# Patient Record
Sex: Male | Born: 1940 | Race: White | Hispanic: No | Marital: Married | Smoking: Never smoker
Health system: Southern US, Community
[De-identification: ages and names within clinical notes are randomized; demographics above are authoritative.]

## PROBLEM LIST (undated history)

## (undated) DIAGNOSIS — I10 Essential (primary) hypertension: Secondary | ICD-10-CM

## (undated) DIAGNOSIS — K219 Gastro-esophageal reflux disease without esophagitis: Secondary | ICD-10-CM

## (undated) DIAGNOSIS — Z8489 Family history of other specified conditions: Secondary | ICD-10-CM

## (undated) DIAGNOSIS — E119 Type 2 diabetes mellitus without complications: Secondary | ICD-10-CM

## (undated) DIAGNOSIS — M1990 Unspecified osteoarthritis, unspecified site: Secondary | ICD-10-CM

## (undated) DIAGNOSIS — E785 Hyperlipidemia, unspecified: Secondary | ICD-10-CM

## (undated) DIAGNOSIS — N189 Chronic kidney disease, unspecified: Secondary | ICD-10-CM

## (undated) DIAGNOSIS — I2510 Atherosclerotic heart disease of native coronary artery without angina pectoris: Secondary | ICD-10-CM

## (undated) HISTORY — PX: COLONOSCOPY: SHX174

## (undated) HISTORY — PX: CORONARY ANGIOPLASTY: SHX604

## (undated) HISTORY — PX: HERNIA REPAIR: SHX51

## (undated) HISTORY — PX: OTHER SURGICAL HISTORY: SHX169

---

## 2004-12-05 ENCOUNTER — Ambulatory Visit: Payer: Self-pay | Admitting: Family Medicine

## 2004-12-24 ENCOUNTER — Ambulatory Visit: Payer: Self-pay | Admitting: Family Medicine

## 2005-01-24 ENCOUNTER — Ambulatory Visit: Payer: Self-pay | Admitting: Family Medicine

## 2005-02-21 ENCOUNTER — Ambulatory Visit: Payer: Self-pay | Admitting: Family Medicine

## 2005-03-24 ENCOUNTER — Ambulatory Visit: Payer: Self-pay | Admitting: Family Medicine

## 2005-07-11 ENCOUNTER — Ambulatory Visit: Payer: Self-pay | Admitting: General Surgery

## 2005-07-18 ENCOUNTER — Ambulatory Visit: Payer: Self-pay | Admitting: General Surgery

## 2012-04-27 ENCOUNTER — Ambulatory Visit: Payer: Self-pay | Admitting: Medical

## 2012-04-29 LAB — BETA STREP CULTURE(ARMC)

## 2014-11-16 DIAGNOSIS — I10 Essential (primary) hypertension: Secondary | ICD-10-CM | POA: Insufficient documentation

## 2014-11-16 DIAGNOSIS — E7800 Pure hypercholesterolemia, unspecified: Secondary | ICD-10-CM | POA: Insufficient documentation

## 2015-09-17 ENCOUNTER — Emergency Department: Payer: Medicare Other

## 2015-09-17 ENCOUNTER — Emergency Department
Admission: EM | Admit: 2015-09-17 | Discharge: 2015-09-17 | Disposition: A | Discharge status: Home / Self Care | Payer: Medicare Other | Attending: Emergency Medicine | Admitting: Emergency Medicine

## 2015-09-17 ENCOUNTER — Encounter: Payer: Self-pay | Admitting: Emergency Medicine

## 2015-09-17 DIAGNOSIS — S8991XA Unspecified injury of right lower leg, initial encounter: Secondary | ICD-10-CM | POA: Diagnosis present

## 2015-09-17 DIAGNOSIS — S82001A Unspecified fracture of right patella, initial encounter for closed fracture: Secondary | ICD-10-CM

## 2015-09-17 DIAGNOSIS — Y92009 Unspecified place in unspecified non-institutional (private) residence as the place of occurrence of the external cause: Secondary | ICD-10-CM | POA: Insufficient documentation

## 2015-09-17 DIAGNOSIS — S86812A Strain of other muscle(s) and tendon(s) at lower leg level, left leg, initial encounter: Secondary | ICD-10-CM | POA: Insufficient documentation

## 2015-09-17 DIAGNOSIS — Y9389 Activity, other specified: Secondary | ICD-10-CM | POA: Insufficient documentation

## 2015-09-17 DIAGNOSIS — Y998 Other external cause status: Secondary | ICD-10-CM | POA: Diagnosis not present

## 2015-09-17 DIAGNOSIS — E119 Type 2 diabetes mellitus without complications: Secondary | ICD-10-CM | POA: Diagnosis not present

## 2015-09-17 DIAGNOSIS — Z720 Tobacco use: Secondary | ICD-10-CM | POA: Diagnosis not present

## 2015-09-17 DIAGNOSIS — W109XXA Fall (on) (from) unspecified stairs and steps, initial encounter: Secondary | ICD-10-CM | POA: Diagnosis not present

## 2015-09-17 DIAGNOSIS — S86912A Strain of unspecified muscle(s) and tendon(s) at lower leg level, left leg, initial encounter: Secondary | ICD-10-CM

## 2015-09-17 HISTORY — DX: Type 2 diabetes mellitus without complications: E11.9

## 2015-09-17 MED ORDER — TRAMADOL HCL 50 MG PO TABS
100.0000 mg | ORAL_TABLET | Freq: Once | ORAL | Status: AC
  Administered 2015-09-17: 100 mg via ORAL

## 2015-09-17 MED ORDER — TRAMADOL HCL 50 MG PO TABS: 50.0000 mg | ORAL_TABLET | Freq: Two times a day (BID) | ORAL | Status: DC

## 2015-09-17 MED ORDER — BACITRACIN ZINC 500 UNIT/GM EX OINT
TOPICAL_OINTMENT | CUTANEOUS | Status: AC
  Filled 2015-09-17: qty 0.9

## 2015-09-17 MED ORDER — NAPROXEN 500 MG PO TBEC: 500.0000 mg | DELAYED_RELEASE_TABLET | Freq: Two times a day (BID) | ORAL | Status: DC

## 2015-09-17 MED ORDER — TRAMADOL HCL 50 MG PO TABS
ORAL_TABLET | ORAL | Status: AC
  Administered 2015-09-17: 100 mg via ORAL
  Filled 2015-09-17: qty 2

## 2015-09-17 NOTE — ED Notes (Signed)
Pt reports that he was coming down steps and slipped. He states that he heard and felt a "pop" in his right knee and then actually hit the floor with his left knee, which also hurts. Pt doesn't remember if he hit his head or not. NAD noted.

## 2015-09-17 NOTE — ED Provider Notes (Signed)
[facility] Emergency Department Provider Note ____________________________________________  Time seen: 1555  I have reviewed the triage vital signs and the nursing notes.  HISTORY  Chief Complaint  Knee Pain  HPI Clayton Parker is a 74 y.o. male presents to the ED for evaluation of injury sustained following a fall in his home. He describes falling down his steps when he lost his footing on carpet, causing him to twist his right knee, and landed on his left side. He noted an immediate pop to the right knee.He notes pain and tightness to the top of the knee when he attempts to bend the joint, and some discomfort to the lateral aspect of the left knee.  Past Medical History  Diagnosis Date  . Diabetes mellitus without complication    There are no active problems to display for this patient.  Past Surgical History  Procedure Laterality Date  . Cardaic stent      2004  . Hernia repair     Current Outpatient Rx  Name  Route  Sig  Dispense  Refill  . naproxen (EC NAPROSYN) 500 MG EC tablet   Oral   Take 1 tablet (500 mg total) by mouth 2 (two) times daily with a meal.   30 tablet   0   . traMADol (ULTRAM) 50 MG tablet   Oral   Take 1 tablet (50 mg total) by mouth 2 (two) times daily.   10 tablet   0     Allergies Review of patient's allergies indicates no known allergies.  No family history on file.  Social History Social History  Substance Use Topics  . Smoking status: Current Every Day Smoker  . Smokeless tobacco: None  . Alcohol Use: No   Review of Systems  Constitutional: Negative for fever. Eyes: Negative for visual changes. ENT: Negative for sore throat. Cardiovascular: Negative for chest pain. Respiratory: Negative for shortness of breath. Gastrointestinal: Negative for abdominal pain, vomiting and diarrhea. Genitourinary: Negative for dysuria. Musculoskeletal: Negative for back pain. Right > Left knee pain Skin: Negative  for rash. Neurological: Negative for headaches, focal weakness or numbness. ____________________________________________  PHYSICAL EXAM:  VITAL SIGNS: ED Triage Vitals  Enc Vitals Group     BP 09/17/15 1513 117/51 mmHg     Pulse Rate 09/17/15 1513 71     Resp 09/17/15 1513 20     Temp 09/17/15 1513 97.7 F (36.5 C)     Temp Source 09/17/15 1513 Oral     SpO2 09/17/15 1513 98 %     Weight 09/17/15 1513 149 lb (67.586 kg)     Height 09/17/15 1513  (1.651 m)     Head Cir --      Peak Flow --      Pain Score 09/17/15 1514 5     Pain Loc --      Pain Edu? --      Excl. in GC? --    Constitutional: Alert and oriented. Well appearing and in no distress. Eyes: Conjunctivae are normal. PERRL. Normal extraocular movements. ENT   Head: Normocephalic and atraumatic.   Nose: No congestion/rhinorrhea.   Mouth/Throat: Mucous membranes are moist.   Neck: Supple. No thyromegaly. Hematological/Lymphatic/Immunological: No cervical lymphadenopathy. Cardiovascular: Normal rate, regular rhythm.  Respiratory: Normal respiratory effort. No wheezes/rales/rhonchi. Gastrointestinal: Soft and nontender. No distention. Musculoskeletal: Nontender with normal range of motion in all extremities.  Neurologic:  Normal gait without ataxia. Normal speech and language. No gross focal neurologic deficits  are appreciated. Skin:  Skin is warm, dry and intact. No rash noted. Psychiatric: Mood and affect are normal. Patient exhibits appropriate insight and judgment. ____________________________________________   RADIOLOGY  Right Knee IMPRESSION: Acute avulsion fracture from the superior aspect of the patella.  Left Knee IMPRESSION: Degenerative change without acute abnormality.  I, Menshew, Charlesetta Ivory, personally viewed and evaluated these images (plain radiographs) as part of my medical decision making.  ____________________________________________  PROCEDURES  Jones compression  wrap Knee immobilizer ____________________________________________  INITIAL IMPRESSION / ASSESSMENT AND PLAN / ED COURSE  Patient with initial fracture care provided in the ED. He will follow-up with Dr. Rosita Kea for further fracture care of a right patella avulsion fracture. Prescriptions for Naprosyn and Ultram were provided. ----------------------------------------- 4:47 PM on 09/17/2015 ----------------------------------------- Spoke with Dr. Rosita Kea who will see the patient in the office on Wednesday. Possible surgical intervention. Constant knee extension is imperative.   ____________________________________________  FINAL CLINICAL IMPRESSION(S) / ED DIAGNOSES  Final diagnoses:  Patella fracture, right, closed, initial encounter  Knee strain, left, initial encounter       Lissa Hoard, PA-C 09/17/15 1708  Loleta Rose, MD 09/17/15 2358

## 2015-09-17 NOTE — ED Notes (Signed)
[name] PA assisted with demonstrating crutches use and ambulating with crutches and getting into and out of chair. [name] NA applied ace wrap as ordered as well as knee immobilizer.

## 2015-09-17 NOTE — Discharge Instructions (Signed)
Cast or Splint Care Casts and splints support injured limbs and keep bones from moving while they heal.  HOME CARE  Keep the cast or splint uncovered during the drying period.  A plaster cast can take 24 to 48 hours to dry.  A fiberglass cast will dry in less than 1 hour.  Do not rest the cast on anything harder than a pillow for 24 hours.  Do not put weight on your injured limb. Do not put pressure on the cast. Wait for your doctor's approval.  Keep the cast or splint dry.  Cover the cast or splint with a plastic bag during baths or wet weather.  If you have a cast over your chest and belly (trunk), take sponge baths until the cast is taken off.  If your cast gets wet, dry it with a towel or blow dryer. Use the cool setting on the blow dryer.  Keep your cast or splint clean. Wash a dirty cast with a damp cloth.  Do not put any objects under your cast or splint.  Do not scratch the skin under the cast with an object. If itching is a problem, use a blow dryer on a cool setting over the itchy area.  Do not trim or cut your cast.  Do not take out the padding from inside your cast.  Exercise your joints near the cast as told by your doctor.  Raise (elevate) your injured limb on 1 or 2 pillows for the first 1 to 3 days. GET HELP IF:  Your cast or splint cracks.  Your cast or splint is too tight or too loose.  You itch badly under the cast.  Your cast gets wet or has a soft spot.  You have a bad smell coming from the cast.  You get an object stuck under the cast.  Your skin around the cast becomes red or sore.  You have new or more pain after the cast is put on. GET HELP RIGHT AWAY IF:  You have fluid leaking through the cast.  You cannot move your fingers or toes.  Your fingers or toes turn blue or white or are cool, painful, or puffy (swollen).  You have tingling or lose feeling (numbness) around the injured area.  You have bad pain or pressure under the  cast.  You have trouble breathing or have shortness of breath.  You have chest pain. Document Released: 04/11/2011 Document Revised: 08/12/2013 Document Reviewed: 06/18/2013 [facility] Patient Information 2015 [place], [place]. This information is not intended to replace advice given to you by your health care provider. Make sure you discuss any questions you have with your health care provider.  Patellar Fracture, Adult A patellar fracture is a break in your kneecap (patella).  CAUSES   A direct blow to the knee or a fall is usually the cause of a broken patella.  A very hard and strong bending of your knee can cause a patellar fracture. RISK FACTORS Involvement in contact sports, especially sports that involve a lot of jumping. SIGNS AND SYMPTOMS   Tender and swollen knee.  Pain when you move your knee, especially when you try to straighten out your leg.  Difficulty walking or putting weight on your knee.  Misshapen knee (as if a bone is out of place). DIAGNOSIS  Patellar fracture is usually diagnosed with a physical exam and an X-ray exam. TREATMENT  Treatment depends on the type of fracture:  If your patella is still in the right  position after the fracture and you can still straighten your leg out, you can usually be treated with a splint or cast for 4-6 weeks.  If your patella is broken into multiple small pieces but you are able to straighten your leg, you can usually be treated with a splint or cast for 4-6 weeks. Sometimes your patella may need to be removed before the cast is applied.  If you cannot straighten out your leg after a patellar fracture, then surgery is required to hold the bony fragments together until they heal. A cast or splint will be applied for 4-6 weeks. HOME CARE INSTRUCTIONS   Only take over-the-counter or prescription medicines for pain, discomfort, or fever as directed by your health care provider.  Use crutches as directed, and exercise the leg  as directed.  Apply ice to the injured area:  Put ice in a plastic bag.  Place a towel between your skin and the bag.  Leave the ice on for 20 minutes, 2-3 times a day.  Elevate the affected knee above the level of your heart. SEEK MEDICAL CARE IF:  You suspect you have significantly injured your knee.  You hear a pop after a knee injury.  Your knee is misshapen after a knee injury.  You have pain when you move your knee.  You have difficulty walking or putting weight on your knee.  You cannot fully move your knee. SEEK IMMEDIATE MEDICAL CARE IF:  You have redness, swelling, or increasing pain in your knee.  You have a fever. Document Released: 09/08/2003 Document Revised: 09/30/2013 Document Reviewed: 07/22/2013 [facility] Patient Information 2015 [place], [place]. This information is not intended to replace advice given to you by your health care provider. Make sure you discuss any questions you have with your health care provider.   Take the prescription meds as directed. Rest with the leg elevated and splinted at all times. Call Dr. Neomia Glass office on Monday for a Wednesday follow-up appointment.

## 2015-09-17 NOTE — ED Notes (Signed)
Returned bacitracin; removed under wrong patient

## 2015-09-17 NOTE — ED Notes (Signed)
Was going down steps, slipped on last step, felt pop R knee, painful since.

## 2016-05-23 DIAGNOSIS — Z808 Family history of malignant neoplasm of other organs or systems: Secondary | ICD-10-CM | POA: Insufficient documentation

## 2016-10-25 ENCOUNTER — Emergency Department: Payer: Medicare Other

## 2016-10-25 ENCOUNTER — Emergency Department
Admission: EM | Admit: 2016-10-25 | Discharge: 2016-10-25 | Disposition: A | Discharge status: Home / Self Care | Payer: Medicare Other | Attending: Emergency Medicine | Admitting: Emergency Medicine

## 2016-10-25 DIAGNOSIS — W1809XA Striking against other object with subsequent fall, initial encounter: Secondary | ICD-10-CM | POA: Insufficient documentation

## 2016-10-25 DIAGNOSIS — F17200 Nicotine dependence, unspecified, uncomplicated: Secondary | ICD-10-CM | POA: Insufficient documentation

## 2016-10-25 DIAGNOSIS — Z79899 Other long term (current) drug therapy: Secondary | ICD-10-CM | POA: Insufficient documentation

## 2016-10-25 DIAGNOSIS — Z23 Encounter for immunization: Secondary | ICD-10-CM | POA: Insufficient documentation

## 2016-10-25 DIAGNOSIS — Y939 Activity, unspecified: Secondary | ICD-10-CM | POA: Insufficient documentation

## 2016-10-25 DIAGNOSIS — S0181XA Laceration without foreign body of other part of head, initial encounter: Secondary | ICD-10-CM | POA: Diagnosis not present

## 2016-10-25 DIAGNOSIS — E119 Type 2 diabetes mellitus without complications: Secondary | ICD-10-CM | POA: Diagnosis not present

## 2016-10-25 DIAGNOSIS — Y999 Unspecified external cause status: Secondary | ICD-10-CM | POA: Insufficient documentation

## 2016-10-25 DIAGNOSIS — S0990XA Unspecified injury of head, initial encounter: Secondary | ICD-10-CM | POA: Diagnosis present

## 2016-10-25 DIAGNOSIS — W19XXXA Unspecified fall, initial encounter: Secondary | ICD-10-CM

## 2016-10-25 DIAGNOSIS — Z791 Long term (current) use of non-steroidal anti-inflammatories (NSAID): Secondary | ICD-10-CM | POA: Insufficient documentation

## 2016-10-25 DIAGNOSIS — Y929 Unspecified place or not applicable: Secondary | ICD-10-CM | POA: Diagnosis not present

## 2016-10-25 MED ORDER — TETANUS-DIPHTH-ACELL PERTUSSIS 5-2.5-18.5 LF-MCG/0.5 IM SUSP
0.5000 mL | Freq: Once | INTRAMUSCULAR | Status: AC
  Administered 2016-10-25: 0.5 mL via INTRAMUSCULAR
  Filled 2016-10-25: qty 0.5

## 2016-10-25 NOTE — ED Provider Notes (Signed)
[facility] Emergency Department Provider Note   ____________________________________________   First MD Initiated Contact with Patient 10/25/16 1935     (approximate)  I have reviewed the triage vital signs and the nursing notes.   HISTORY  Chief Complaint Fall   HPI Clayton Parker is a 75 y.o. male with a history of diabetes who had a mechanical fall this evening and fell headfirst into a wall. He denies any loss of consciousness. Says that he has a frontal headache over the site of the injury. Says that he is also experiencing some "stiffness" across the back of his neck but does not have any pain and is able to range his neck freely.Denies being on any blood thinning medications.   Past Medical History:  Diagnosis Date  . Diabetes mellitus without complication (HCC)     There are no active problems to display for this patient.   Past Surgical History:  Procedure Laterality Date  . cardaic stent     2004  . HERNIA REPAIR      Prior to Admission medications   Medication Sig Start Date End Date Taking? Authorizing Provider  naproxen (EC NAPROSYN) 500 MG EC tablet Take 1 tablet (500 mg total) by mouth 2 (two) times daily with a meal. 09/17/15   [name] V Bacon Menshew, PA-C  traMADol (ULTRAM) 50 MG tablet Take 1 tablet (50 mg total) by mouth 2 (two) times daily. 09/17/15   [name] V Bacon Menshew, PA-C    Allergies Review of patient's allergies indicates no known allergies.  No family history on file.  Social History Social History  Substance Use Topics  . Smoking status: Current Every Day Smoker  . Smokeless tobacco: Never Used  . Alcohol use No    Review of Systems Constitutional: No fever/chills Eyes: No visual changes. ENT: No sore throat. Cardiovascular: Denies chest pain. Respiratory: Denies shortness of breath. Gastrointestinal: No abdominal pain.  No nausea, no vomiting.  No diarrhea.  No constipation. Genitourinary:  Negative for dysuria. Musculoskeletal: Negative for back pain. Skin: Negative for rash. Neurological: Negative for  focal weakness or numbness.  10-point ROS otherwise negative.  ____________________________________________   PHYSICAL EXAM:  VITAL SIGNS: ED Triage Vitals  Enc Vitals Group     BP 10/25/16 1930 (!) 157/68     Pulse Rate 10/25/16 1930 90     Resp 10/25/16 1931 19     Temp 10/25/16 1931 97.9 F (36.6 C)     Temp Source 10/25/16 1931 Oral     SpO2 10/25/16 1930 97 %     Weight 10/25/16 1932 150 lb (68 kg)     Height 10/25/16 1932 5\' 5"  (1.651 m)     Head Circumference --      Peak Flow --      Pain Score 10/25/16 1932 4     Pain Loc --      Pain Edu? --      Excl. in GC? --     Constitutional: Alert and oriented. Well appearing and in no acute distress. Eyes: Conjunctivae are normal. PERRL. EOMI. Head:  Frontal hematoma 3 x 4 cm without any bogginess or depression. 3 cm laceration which appears superficial and well approximated. Small oozing of blood that only uses when the bandage is removed. Nose: No congestion/rhinnorhea. Mouth/Throat: Mucous membranes are moist.   Neck: No stridor.  No tenderness to midline cervical spine. No deformity or step-off. The patient is able to range his head and  neck freely without any obvious extraction or severe pain.  Cardiovascular: Normal rate, regular rhythm. Grossly normal heart sounds.  Respiratory: Normal respiratory effort.  No retractions. Lungs CTAB. Gastrointestinal: Soft and nontender. No distention. Musculoskeletal: Abrasion to the anterior left knee without any joint effusion. Patient with full active range of motion of the left knee without any pain. Neurologic:  Normal speech and language. No gross focal neurologic deficits are appreciated. Skin:  Skin is warm, dry and intact. No rash noted. Psychiatric: Mood and affect are normal. Speech and behavior are normal.  ____________________________________________    LABS (all labs ordered are listed, but only abnormal results are displayed)  Labs Reviewed - No data to display ____________________________________________  EKG   ____________________________________________  RADIOLOGY  CT Head Wo Contrast (Final result)  Result time 10/25/16 20:09:21  Final result by Princella PellegriniHarold Hall, MD (10/25/16 [id number]:09:21)           Narrative:   CLINICAL DATA: 75 year old male status post fall at home with frontal hematoma. Initial encounter.  EXAM: CT HEAD WITHOUT CONTRAST  TECHNIQUE: Contiguous axial images were obtained from the base of the skull through the vertex without intravenous contrast.  COMPARISON: None.  FINDINGS: Brain: No midline shift, ventriculomegaly, mass effect, evidence of mass lesion, intracranial hemorrhage or evidence of cortically based acute infarction. Mild for age scattered white matter hypodensity. No cortical encephalomalacia identified. Cerebral volume is within normal limits for age.  Vascular: Calcified atherosclerosis at the skull base.  Skull: No acute osseous abnormality identified.  Sinuses/Orbits: Partially visible right maxillary sinus mucosal thickening. Other Visualized paranasal sinuses and mastoids are well pneumatized.  Other: Right anterior superior forehead scalp hematoma and laceration with subcutaneous gas and hematoma measuring up to 9 mm in thickness. Underlying right frontal bone intact.  Visualized orbit soft tissues are within normal limits. Other scalp soft tissues appear normal.  IMPRESSION: 1. Scalp soft tissue injury without underlying fracture. 2. No acute intracranial abnormality and largely unremarkable for age noncontrast CT appearance of the brain.   Electronically Signed By: Odessa FlemingH Hall M.D. On: 10/25/2016 20:09            ____________________________________________   PROCEDURES  Procedure(s) performed:   Procedures  Critical Care performed:    ____________________________________________   INITIAL IMPRESSION / ASSESSMENT AND PLAN / ED COURSE  Pertinent labs & imaging results that were available during my care of the patient were reviewed by me and considered in my medical decision making (see chart for details).  ----------------------------------------- 8:42 PM on 10/25/2016 -----------------------------------------  Well approximated laceration to the forehead without any bleeding. The bandages when there is gauze overlying. I feel that suturing her skin glue would be high risk of infection and scarring than leaving the laceration open. Explained this to the patient as well as keeping it dry for 24 hours. We also discussed return precautions including any worsening or concerning symptoms. The patient's wife is at the bedside and they are both understanding of this plan went to comply. Will be discharged home.  Clinical Course     ____________________________________________   FINAL CLINICAL IMPRESSION(S) / ED DIAGNOSES  Mechanical fall. Head laceration.    NEW MEDICATIONS STARTED DURING THIS VISIT:  New Prescriptions   No medications on file     Note:  This document was prepared using Dragon voice recognition software and may include unintentional dictation errors.    Myrna Blazeravid Matthew Katharina Jehle, MD 10/25/16 2043

## 2016-10-25 NOTE — ED Triage Notes (Signed)
Pt bib EMS from home w/ c/o mechanical fall.  Pt has abrasion to forehead.  Denies LOC or blood thinners.  Pt A/O X4.  Able to ambulate to EMS truck.  NAD.

## 2016-10-25 NOTE — ED Notes (Signed)
Laceration to forehead wrapped with curlex at this time. Wound clean and dry at this time

## 2017-11-27 ENCOUNTER — Other Ambulatory Visit: Payer: Self-pay

## 2017-11-27 ENCOUNTER — Encounter: Payer: Self-pay | Admitting: *Deleted

## 2017-11-29 NOTE — Discharge Instructions (Signed)

## 2017-12-03 ENCOUNTER — Encounter
Admission: RE | Disposition: A | Discharge status: Home / Self Care | Payer: Self-pay | Source: Ambulatory Visit | Attending: Ophthalmology

## 2017-12-03 ENCOUNTER — Ambulatory Visit: Payer: Medicare Other | Admitting: Anesthesiology

## 2017-12-03 ENCOUNTER — Ambulatory Visit
Admission: RE | Admit: 2017-12-03 | Discharge: 2017-12-03 | Disposition: A | Discharge status: Home / Self Care | Payer: Medicare Other | Source: Ambulatory Visit | Attending: Ophthalmology | Admitting: Ophthalmology

## 2017-12-03 DIAGNOSIS — K219 Gastro-esophageal reflux disease without esophagitis: Secondary | ICD-10-CM | POA: Insufficient documentation

## 2017-12-03 DIAGNOSIS — I2510 Atherosclerotic heart disease of native coronary artery without angina pectoris: Secondary | ICD-10-CM | POA: Diagnosis not present

## 2017-12-03 DIAGNOSIS — E119 Type 2 diabetes mellitus without complications: Secondary | ICD-10-CM | POA: Insufficient documentation

## 2017-12-03 DIAGNOSIS — G4730 Sleep apnea, unspecified: Secondary | ICD-10-CM | POA: Insufficient documentation

## 2017-12-03 DIAGNOSIS — Z7984 Long term (current) use of oral hypoglycemic drugs: Secondary | ICD-10-CM | POA: Insufficient documentation

## 2017-12-03 DIAGNOSIS — H2512 Age-related nuclear cataract, left eye: Secondary | ICD-10-CM | POA: Insufficient documentation

## 2017-12-03 DIAGNOSIS — Z955 Presence of coronary angioplasty implant and graft: Secondary | ICD-10-CM | POA: Diagnosis not present

## 2017-12-03 DIAGNOSIS — Z79899 Other long term (current) drug therapy: Secondary | ICD-10-CM | POA: Diagnosis not present

## 2017-12-03 DIAGNOSIS — Z85828 Personal history of other malignant neoplasm of skin: Secondary | ICD-10-CM | POA: Insufficient documentation

## 2017-12-03 DIAGNOSIS — Z7982 Long term (current) use of aspirin: Secondary | ICD-10-CM | POA: Insufficient documentation

## 2017-12-03 DIAGNOSIS — I10 Essential (primary) hypertension: Secondary | ICD-10-CM | POA: Insufficient documentation

## 2017-12-03 HISTORY — DX: Essential (primary) hypertension: I10

## 2017-12-03 HISTORY — DX: Family history of other specified conditions: Z84.89

## 2017-12-03 HISTORY — PX: CATARACT EXTRACTION W/PHACO: SHX586

## 2017-12-03 HISTORY — DX: Hyperlipidemia, unspecified: E78.5

## 2017-12-03 LAB — GLUCOSE, CAPILLARY
GLUCOSE-CAPILLARY: 150 mg/dL — AB (ref 65–99)
Glucose-Capillary: 121 mg/dL — ABNORMAL HIGH (ref 65–99)

## 2017-12-03 SURGERY — PHACOEMULSIFICATION, CATARACT, WITH IOL INSERTION
Anesthesia: General | Laterality: Left | Wound class: Clean

## 2017-12-03 MED ORDER — MIDAZOLAM HCL 2 MG/2ML IJ SOLN
INTRAMUSCULAR | Status: DC | PRN
  Administered 2017-12-03: 1.5 mg via INTRAVENOUS

## 2017-12-03 MED ORDER — MOXIFLOXACIN HCL 0.5 % OP SOLN
OPHTHALMIC | Status: DC | PRN
  Administered 2017-12-03: 0.2 mL via OPHTHALMIC

## 2017-12-03 MED ORDER — ACETAMINOPHEN 325 MG PO TABS: 650.0000 mg | ORAL_TABLET | Freq: Once | ORAL | Status: DC | PRN

## 2017-12-03 MED ORDER — LACTATED RINGERS IV SOLN: INTRAVENOUS | Status: DC

## 2017-12-03 MED ORDER — SODIUM HYALURONATE 23 MG/ML IO SOLN
INTRAOCULAR | Status: DC | PRN
  Administered 2017-12-03: 0.6 mL via INTRAOCULAR

## 2017-12-03 MED ORDER — FENTANYL CITRATE (PF) 100 MCG/2ML IJ SOLN
INTRAMUSCULAR | Status: DC | PRN
  Administered 2017-12-03: 50 ug via INTRAVENOUS

## 2017-12-03 MED ORDER — ARMC OPHTHALMIC DILATING DROPS
1.0000 | OPHTHALMIC | Status: DC | PRN
Start: 2017-12-03 — End: 2017-12-03
  Administered 2017-12-03 (×3): 1 via OPHTHALMIC

## 2017-12-03 MED ORDER — LIDOCAINE HCL (PF) 2 % IJ SOLN
INTRAOCULAR | Status: DC | PRN
  Administered 2017-12-03: 1 mL via INTRAOCULAR

## 2017-12-03 MED ORDER — ONDANSETRON HCL 4 MG/2ML IJ SOLN: 4.0000 mg | Freq: Once | INTRAMUSCULAR | Status: DC | PRN

## 2017-12-03 MED ORDER — ACETAMINOPHEN 160 MG/5ML PO SOLN
325.0000 mg | ORAL | Status: DC | PRN
Start: 2017-12-03 — End: 2017-12-03

## 2017-12-03 MED ORDER — BSS IO SOLN
INTRAOCULAR | Status: DC | PRN
  Administered 2017-12-03: 104 mL via OPHTHALMIC

## 2017-12-03 MED ORDER — SODIUM HYALURONATE 10 MG/ML IO SOLN
INTRAOCULAR | Status: DC | PRN
  Administered 2017-12-03: 0.55 mL via INTRAOCULAR

## 2017-12-03 SURGICAL SUPPLY — 16 items

## 2017-12-03 NOTE — Anesthesia Preprocedure Evaluation (Signed)
Anesthesia Evaluation  Patient identified by MRN, date of birth, ID band Patient awake    History of Anesthesia Complications Negative for: history of anesthetic complications  Airway Mallampati: IV  TM Distance: >3 FB Neck ROM: Full    Dental  (+)    Pulmonary  Hx sleep apnea, resolved s/p weight loss   Pulmonary exam normal breath sounds clear to auscultation       Cardiovascular Exercise Tolerance: Good hypertension, + CAD and + Cardiac Stents  Normal cardiovascular exam Rhythm:Regular Rate:Normal  ECG 05/16/17: normal   Neuro/Psych negative neurological ROS     GI/Hepatic GERD  ,  Endo/Other  diabetes, Type 2  Renal/GU negative Renal ROS     Musculoskeletal   Abdominal   Peds  Hematology negative hematology ROS (+)   Anesthesia Other Findings   Reproductive/Obstetrics                             Anesthesia Physical Anesthesia Plan  ASA: III  Anesthesia Plan: General   Post-op Pain Management:    Induction: Intravenous  PONV Risk Score and Plan: 1  Airway Management Planned: Natural Airway  Additional Equipment:   Intra-op Plan:   Post-operative Plan:   Informed Consent: I have reviewed the patients History and Physical, chart, labs and discussed the procedure including the risks, benefits and alternatives for the proposed anesthesia with the patient or authorized representative who has indicated his/her understanding and acceptance.     Plan Discussed with: CRNA  Anesthesia Plan Comments:         Anesthesia Quick Evaluation

## 2017-12-03 NOTE — H&P (Signed)
The History and Physical notes are on paper, have been signed, and are to be scanned.   I have examined the patient and there are no changes to the H&P.   Willey BladeBradley Jamille Yoshino 12/03/2017 8:52 AM

## 2017-12-03 NOTE — Transfer of Care (Signed)
Immediate Anesthesia Transfer of Care Note  Patient: Clayton Parker  Procedure(s) Performed: CATARACT EXTRACTION PHACO AND INTRAOCULAR LENS PLACEMENT (IOC) LEFT DIABETIC (Left )  Patient Location: PACU  Anesthesia Type: General  Level of Consciousness: awake, alert  and patient cooperative  Airway and Oxygen Therapy: Patient Spontanous Breathing and Patient connected to supplemental oxygen  Post-op Assessment: Post-op Vital signs reviewed, Patient's Cardiovascular Status Stable, Respiratory Function Stable, Patent Airway and No signs of Nausea or vomiting  Post-op Vital Signs: Reviewed and stable  Complications: No apparent anesthesia complications

## 2017-12-03 NOTE — Op Note (Signed)
OPERATIVE NOTE  Clayton SheererFrederick J Nabi [id number] 12/03/2017   PREOPERATIVE DIAGNOSIS:  Nuclear sclerotic cataract left eye.  H25.12   POSTOPERATIVE DIAGNOSIS:     1.  Nuclear sclerotic cataract left eye.   2.  Intraoperative floppy iris syndrome.   PROCEDURE:  Phacoemusification with posterior chamber intraocular lens placement of the left eye   LENS:  * No implants in log *   ZCB00 +20.0  ZCB00 +20.0 SN [id number] Exp 09/2020   ULTRASOUND TIME:  0 minutes 52.9 seconds.  CDE 12.04   SURGEON:  Willey BladeBradley Montrae Braithwaite, MD, MPH   ANESTHESIA:  Topical with tetracaine drops augmented with 1% preservative-free intracameral lidocaine.  ESTIMATED BLOOD LOSS: <1 mL   COMPLICATIONS:  None.   DESCRIPTION OF PROCEDURE:  The patient was identified in the holding room and transported to the operating room and placed in the supine position under the operating microscope.  The left eye was identified as the operative eye and it was prepped and draped in the usual sterile ophthalmic fashion.   A 1.0 millimeter clear-corneal paracentesis was made at the 5:00 position. 0.5 ml of preservative-free 1% lidocaine with epinephrine was injected into the anterior chamber.  The anterior chamber was filled with Healon 5 viscoelastic.  A 2.4 millimeter keratome was used to make a near-clear corneal incision at the 2:00 position.  A curvilinear capsulorrhexis was made with a cystotome and capsulorrhexis forceps.  Balanced salt solution was used to hydrodissect and hydrodelineate the nucleus.  The pupil was relatively small and floppy but did not require a pupil expansion device.    Phacoemulsification was then used in stop and chop fashion to remove the lens nucleus and epinucleus.  The remaining cortex was then removed using the irrigation and aspiration handpiece.   The periphery was carefully inspected and there were no remaining lens fragments.  Healon was then placed into the capsular bag to distend it for lens  placement.  On the first attempt to inject the lens it was stuck in the wound and was removed and reloaded into a cartridge.  The lens was then injected into the capsular bag.  The remaining viscoelastic was aspirated.   Wounds were hydrated with balanced salt solution.  The anterior chamber was inflated to a physiologic pressure with balanced salt solution.   Intracameral vigamox 0.1 mL undiltued was injected into the eye and a drop placed onto the ocular surface.  No wound leaks were noted.  The patient was taken to the recovery room in stable condition without complications of anesthesia or surgery  Willey BladeBradley Hayze Gazda 12/03/2017, 9:58 AM

## 2017-12-03 NOTE — Anesthesia Procedure Notes (Signed)
Procedure Name: MAC Performed by: Taj Nevins, CRNA Pre-anesthesia Checklist: Patient identified, Emergency Drugs available, Suction available, Timeout performed and Patient being monitored Patient Re-evaluated:Patient Re-evaluated prior to induction Oxygen Delivery Method: Nasal cannula Placement Confirmation: positive ETCO2       

## 2017-12-03 NOTE — Anesthesia Postprocedure Evaluation (Signed)
Anesthesia Post Note  Patient: Clayton Parker  Procedure(s) Performed: CATARACT EXTRACTION PHACO AND INTRAOCULAR LENS PLACEMENT (IOC) LEFT DIABETIC (Left )  Patient location during evaluation: PACU Anesthesia Type: General Level of consciousness: awake and alert, oriented and patient cooperative Pain management: pain level controlled Vital Signs Assessment: post-procedure vital signs reviewed and stable Respiratory status: spontaneous breathing, nonlabored ventilation and respiratory function stable Cardiovascular status: blood pressure returned to baseline and stable Postop Assessment: adequate PO intake Anesthetic complications: no    Reed BreechAndrea Daryn Pisani

## 2017-12-04 ENCOUNTER — Encounter: Payer: Self-pay | Admitting: Ophthalmology

## 2018-02-12 ENCOUNTER — Other Ambulatory Visit: Payer: Self-pay

## 2018-02-12 ENCOUNTER — Encounter: Payer: Self-pay | Admitting: *Deleted

## 2018-02-17 NOTE — Discharge Instructions (Signed)

## 2018-02-18 ENCOUNTER — Ambulatory Visit
Admission: RE | Admit: 2018-02-18 | Discharge: 2018-02-18 | Disposition: A | Discharge status: Home / Self Care | Payer: Medicare Other | Source: Ambulatory Visit | Attending: Ophthalmology | Admitting: Ophthalmology

## 2018-02-18 ENCOUNTER — Ambulatory Visit: Payer: Medicare Other | Admitting: Anesthesiology

## 2018-02-18 ENCOUNTER — Encounter
Admission: RE | Disposition: A | Discharge status: Home / Self Care | Payer: Self-pay | Source: Ambulatory Visit | Attending: Ophthalmology

## 2018-02-18 DIAGNOSIS — H2511 Age-related nuclear cataract, right eye: Secondary | ICD-10-CM | POA: Diagnosis present

## 2018-02-18 DIAGNOSIS — I10 Essential (primary) hypertension: Secondary | ICD-10-CM | POA: Diagnosis not present

## 2018-02-18 DIAGNOSIS — E119 Type 2 diabetes mellitus without complications: Secondary | ICD-10-CM | POA: Diagnosis not present

## 2018-02-18 DIAGNOSIS — Z85828 Personal history of other malignant neoplasm of skin: Secondary | ICD-10-CM | POA: Diagnosis not present

## 2018-02-18 DIAGNOSIS — Z955 Presence of coronary angioplasty implant and graft: Secondary | ICD-10-CM | POA: Insufficient documentation

## 2018-02-18 DIAGNOSIS — K219 Gastro-esophageal reflux disease without esophagitis: Secondary | ICD-10-CM | POA: Diagnosis not present

## 2018-02-18 DIAGNOSIS — I2510 Atherosclerotic heart disease of native coronary artery without angina pectoris: Secondary | ICD-10-CM | POA: Diagnosis not present

## 2018-02-18 HISTORY — PX: CATARACT EXTRACTION W/PHACO: SHX586

## 2018-02-18 LAB — GLUCOSE, CAPILLARY
GLUCOSE-CAPILLARY: 106 mg/dL — AB (ref 65–99)
Glucose-Capillary: 122 mg/dL — ABNORMAL HIGH (ref 65–99)

## 2018-02-18 SURGERY — PHACOEMULSIFICATION, CATARACT, WITH IOL INSERTION
Anesthesia: Monitor Anesthesia Care | Laterality: Right | Wound class: Clean

## 2018-02-18 MED ORDER — LACTATED RINGERS IV SOLN: INTRAVENOUS | Status: DC

## 2018-02-18 MED ORDER — EPINEPHRINE PF 1 MG/ML IJ SOLN
INTRAOCULAR | Status: DC | PRN
  Administered 2018-02-18: 101 mL via OPHTHALMIC

## 2018-02-18 MED ORDER — ACETAMINOPHEN 160 MG/5ML PO SOLN: 325.0000 mg | Freq: Once | ORAL | Status: DC

## 2018-02-18 MED ORDER — ACETAMINOPHEN 325 MG PO TABS: 325.0000 mg | ORAL_TABLET | Freq: Once | ORAL | Status: DC

## 2018-02-18 MED ORDER — FENTANYL CITRATE (PF) 100 MCG/2ML IJ SOLN
INTRAMUSCULAR | Status: DC | PRN
  Administered 2018-02-18: 50 ug via INTRAVENOUS

## 2018-02-18 MED ORDER — MIDAZOLAM HCL 2 MG/2ML IJ SOLN
INTRAMUSCULAR | Status: DC | PRN
  Administered 2018-02-18: 1 mg via INTRAVENOUS

## 2018-02-18 MED ORDER — LIDOCAINE HCL (PF) 2 % IJ SOLN
INTRAOCULAR | Status: DC | PRN
  Administered 2018-02-18: 1 mL via INTRAOCULAR

## 2018-02-18 MED ORDER — MOXIFLOXACIN HCL 0.5 % OP SOLN
OPHTHALMIC | Status: DC | PRN
  Administered 2018-02-18: 0.2 mL via OPHTHALMIC

## 2018-02-18 MED ORDER — SODIUM HYALURONATE 23 MG/ML IO SOLN
INTRAOCULAR | Status: DC | PRN
  Administered 2018-02-18: 0.6 mL via INTRAOCULAR

## 2018-02-18 MED ORDER — ARMC OPHTHALMIC DILATING DROPS
1.0000 "application " | OPHTHALMIC | Status: DC | PRN
  Administered 2018-02-18 (×3): 1 via OPHTHALMIC

## 2018-02-18 MED ORDER — SODIUM HYALURONATE 10 MG/ML IO SOLN
INTRAOCULAR | Status: DC | PRN
  Administered 2018-02-18: 0.55 mL via INTRAOCULAR

## 2018-02-18 SURGICAL SUPPLY — 16 items

## 2018-02-18 NOTE — Op Note (Signed)
OPERATIVE NOTE  Clayton SheererFrederick J Haub [id number] 02/18/2018   PREOPERATIVE DIAGNOSIS:  Nuclear sclerotic cataract right eye.  H25.11   POSTOPERATIVE DIAGNOSIS:    Nuclear sclerotic cataract right eye.     PROCEDURE:  Phacoemusification with posterior chamber intraocular lens placement of the right eye   LENS:   Implant Name Type Inv. Item Serial No. Manufacturer Lot No. LRB No. Used  LENS IOL DIOP 22.0 - [id number] Intraocular Lens LENS IOL DIOP 22.0 [id number]  Right 1       PCB00 +22.0   ULTRASOUND TIME: 1 minutes 02 seconds.  CDE 12.33   SURGEON:  Willey BladeBradley King, MD, MPH  ANESTHESIOLOGIST: Anesthesiologist: Ranee GosselinStella, Michael, MD CRNA: Maryan RuedWilson, Jennifer M, CRNA   ANESTHESIA:  Topical with tetracaine drops augmented with 1% preservative-free intracameral lidocaine.  ESTIMATED BLOOD LOSS: less than 1 mL.   COMPLICATIONS:  None.   DESCRIPTION OF PROCEDURE:  The patient was identified in the holding room and transported to the operating room and placed in the supine position under the operating microscope.  The right eye was identified as the operative eye and it was prepped and draped in the usual sterile ophthalmic fashion.   A 1.0 millimeter clear-corneal paracentesis was made at the 10:30 position. 0.5 ml of preservative-free 1% lidocaine with epinephrine was injected into the anterior chamber.  The anterior chamber was filled with Healon 5 viscoelastic.  A 2.4 millimeter keratome was used to make a near-clear corneal incision at the 8:00 position.  A curvilinear capsulorrhexis was made with a cystotome and capsulorrhexis forceps.  Balanced salt solution was used to hydrodissect and hydrodelineate the nucleus.   Phacoemulsification was then used in stop and chop fashion to remove the lens nucleus and epinucleus.  The remaining cortex was then removed using the irrigation and aspiration handpiece. Healon was then placed into the capsular bag to distend it for lens placement.  A lens  was then injected into the capsular bag.  The remaining viscoelastic was aspirated.   Wounds were hydrated with balanced salt solution.  The anterior chamber was inflated to a physiologic pressure with balanced salt solution.   Intracameral vigamox 0.1 mL undiluted was injected into the eye and a drop placed onto the ocular surface.  No wound leaks were noted.  The patient was taken to the recovery room in stable condition without complications of anesthesia or surgery  Willey BladeBradley King 02/18/2018, 9:58 AM

## 2018-02-18 NOTE — Anesthesia Postprocedure Evaluation (Signed)
Anesthesia Post Note  Patient: Clayton Parker  Procedure(s) Performed: CATARACT EXTRACTION PHACO AND INTRAOCULAR LENS PLACEMENT (IOC) RIGHT DIABETIC (Right )  Patient location during evaluation: PACU Anesthesia Type: MAC Level of consciousness: awake and alert and oriented Pain management: satisfactory to patient Vital Signs Assessment: post-procedure vital signs reviewed and stable Respiratory status: spontaneous breathing, nonlabored ventilation and respiratory function stable Cardiovascular status: blood pressure returned to baseline and stable Postop Assessment: Adequate PO intake and No signs of nausea or vomiting Anesthetic complications: no    Raliegh Ip

## 2018-02-18 NOTE — Anesthesia Preprocedure Evaluation (Signed)
Anesthesia Evaluation  Patient identified by MRN, date of birth, ID band Patient awake    Reviewed: Allergy & Precautions, H&P , NPO status , Patient's Chart, lab work & pertinent test results  History of Anesthesia Complications Negative for: history of anesthetic complications  Airway Mallampati: IV  TM Distance: >3 FB Neck ROM: Full    Dental no notable dental hx.    Pulmonary  Hx sleep apnea, resolved s/p weight loss   Pulmonary exam normal breath sounds clear to auscultation       Cardiovascular Exercise Tolerance: Good hypertension, + CAD and + Cardiac Stents  Normal cardiovascular exam Rhythm:Regular Rate:Normal  ECG 05/16/17: normal   Neuro/Psych negative neurological ROS     GI/Hepatic GERD  ,  Endo/Other  diabetes, Type 2  Renal/GU      Musculoskeletal   Abdominal   Peds  Hematology negative hematology ROS (+)   Anesthesia Other Findings   Reproductive/Obstetrics                             Anesthesia Physical Anesthesia Plan  ASA: II  Anesthesia Plan: MAC   Post-op Pain Management:    Induction:   PONV Risk Score and Plan: 1 and Midazolam and Treatment may vary due to age or medical condition  Airway Management Planned: Natural Airway  Additional Equipment:   Intra-op Plan:   Post-operative Plan:   Informed Consent: I have reviewed the patients History and Physical, chart, labs and discussed the procedure including the risks, benefits and alternatives for the proposed anesthesia with the patient or authorized representative who has indicated his/her understanding and acceptance.     Plan Discussed with: CRNA  Anesthesia Plan Comments:         Anesthesia Quick Evaluation

## 2018-02-18 NOTE — H&P (Signed)
The History and Physical notes are on paper, have been signed, and are to be scanned.   I have examined the patient and there are no changes to the H&P.   Willey BladeBradley Ethin Drummond 02/18/2018 9:21 AM

## 2018-02-18 NOTE — Anesthesia Procedure Notes (Signed)
Procedure Name: MAC Date/Time: 02/18/2018 9:34 AM Performed by: Janna Arch, CRNA Pre-anesthesia Checklist: Patient identified, Emergency Drugs available, Suction available and Patient being monitored Patient Re-evaluated:Patient Re-evaluated prior to induction Oxygen Delivery Method: Nasal cannula

## 2018-02-18 NOTE — Transfer of Care (Signed)
Immediate Anesthesia Transfer of Care Note  Patient: Clayton Parker  Procedure(s) Performed: CATARACT EXTRACTION PHACO AND INTRAOCULAR LENS PLACEMENT (IOC) RIGHT DIABETIC (Right )  Patient Location: PACU  Anesthesia Type: MAC  Level of Consciousness: awake, alert  and patient cooperative  Airway and Oxygen Therapy: Patient Spontanous Breathing and Patient connected to supplemental oxygen  Post-op Assessment: Post-op Vital signs reviewed, Patient's Cardiovascular Status Stable, Respiratory Function Stable, Patent Airway and No signs of Nausea or vomiting  Post-op Vital Signs: Reviewed and stable  Complications: No apparent anesthesia complications

## 2018-03-21 ENCOUNTER — Other Ambulatory Visit: Payer: Self-pay

## 2018-03-21 ENCOUNTER — Emergency Department
Admission: EM | Admit: 2018-03-21 | Discharge: 2018-03-22 | Disposition: A | Discharge status: Home / Self Care | Payer: Medicare Other | Attending: Emergency Medicine | Admitting: Emergency Medicine

## 2018-03-21 ENCOUNTER — Emergency Department: Payer: Medicare Other

## 2018-03-21 DIAGNOSIS — Z7984 Long term (current) use of oral hypoglycemic drugs: Secondary | ICD-10-CM | POA: Diagnosis not present

## 2018-03-21 DIAGNOSIS — Z7982 Long term (current) use of aspirin: Secondary | ICD-10-CM | POA: Insufficient documentation

## 2018-03-21 DIAGNOSIS — Z79899 Other long term (current) drug therapy: Secondary | ICD-10-CM | POA: Diagnosis not present

## 2018-03-21 DIAGNOSIS — R509 Fever, unspecified: Secondary | ICD-10-CM | POA: Diagnosis not present

## 2018-03-21 DIAGNOSIS — E119 Type 2 diabetes mellitus without complications: Secondary | ICD-10-CM | POA: Diagnosis not present

## 2018-03-21 DIAGNOSIS — R0602 Shortness of breath: Secondary | ICD-10-CM | POA: Insufficient documentation

## 2018-03-21 DIAGNOSIS — J069 Acute upper respiratory infection, unspecified: Secondary | ICD-10-CM | POA: Diagnosis not present

## 2018-03-21 DIAGNOSIS — R05 Cough: Secondary | ICD-10-CM | POA: Insufficient documentation

## 2018-03-21 DIAGNOSIS — I10 Essential (primary) hypertension: Secondary | ICD-10-CM | POA: Insufficient documentation

## 2018-03-21 DIAGNOSIS — R000 Tachycardia, unspecified: Secondary | ICD-10-CM | POA: Diagnosis present

## 2018-03-21 LAB — CBC
HEMATOCRIT: 41.2 % (ref 40.0–52.0)
HEMOGLOBIN: 13.8 g/dL (ref 13.0–18.0)
MCH: 30.5 pg (ref 26.0–34.0)
MCHC: 33.4 g/dL (ref 32.0–36.0)
MCV: 91.3 fL (ref 80.0–100.0)
Platelets: 224 10*3/uL (ref 150–440)
RBC: 4.51 MIL/uL (ref 4.40–5.90)
RDW: 13.6 % (ref 11.5–14.5)
WBC: 11.4 10*3/uL — ABNORMAL HIGH (ref 3.8–10.6)

## 2018-03-21 LAB — BASIC METABOLIC PANEL
ANION GAP: 12 (ref 5–15)
BUN: 27 mg/dL — AB (ref 6–20)
CO2: 26 mmol/L (ref 22–32)
Calcium: 9.1 mg/dL (ref 8.9–10.3)
Chloride: 97 mmol/L — ABNORMAL LOW (ref 101–111)
Creatinine, Ser: 0.98 mg/dL (ref 0.61–1.24)
GFR calc Af Amer: >60 mL/min (ref >60)
GFR calc non Af Amer: >60 mL/min (ref >60)
Glucose, Bld: 200 mg/dL — ABNORMAL HIGH (ref 65–99)
POTASSIUM: 3.9 mmol/L (ref 3.5–5.1)
Sodium: 135 mmol/L (ref 135–145)

## 2018-03-21 LAB — TROPONIN I: Troponin I: <0.03 ng/mL (ref <0.03)

## 2018-03-21 NOTE — ED Notes (Signed)
Pt states that he went to doctor on wed to be treated for a sinus infection, their daughter (who Is a PA) check his vs and noticed that his HR was 115 and she heard Crackles. She suggested he come to ER. Wife at bedside. PT alert and oriented x 4. Pt had the flu and pneumonia back in December.

## 2018-03-21 NOTE — ED Notes (Signed)
Informed RN that patient has been roomed and is ready for evaluation.  Patient in NAD at this time and call bell placed within reach.   

## 2018-03-21 NOTE — ED Triage Notes (Signed)
Patient dx with pneumonia and influenza in December. Patient reports family member listened to his lungs and heard crackling. Patient's heart rate 115. Patient reports fever at home of 100.9.

## 2018-03-22 MED ORDER — DOXYCYCLINE HYCLATE 100 MG PO TABS
100.0000 mg | ORAL_TABLET | Freq: Once | ORAL | Status: AC
  Administered 2018-03-22: 100 mg via ORAL
  Filled 2018-03-22: qty 1

## 2018-03-22 MED ORDER — DOXYCYCLINE HYCLATE 100 MG PO CAPS: 100.0000 mg | ORAL_CAPSULE | Freq: Two times a day (BID) | ORAL | 0 refills | Status: DC

## 2018-03-22 NOTE — ED Provider Notes (Signed)
[facility] Emergency Department Provider Note  ___________________________________________   First MD Initiated Contact with Patient 03/21/18 2304     (approximate)  I have reviewed the triage vital signs and the nursing notes.   HISTORY  Chief Complaint Tachycardia   HPI Clayton Parker is a 77 y.o. male with history of diabetes as well as a history of flu and pneumonia this past December who is presenting to the emergency department tonight with shortness of breath as well as cough.  He says the symptoms started this past Wednesday with fever cough and shortness of breath.  He said that he last had a fever before coming into the night of 100.9.  He has not taken Tylenol or ibuprofen.  The patient has a daughter who is a PA who listen to his lungs and heard "crackles."  Patient also was tachycardic at home to about 115.  Patient denies any pain/body aches.  Says that he was having a productive cough in the illness initially started but now is having a dry cough.  Has taken several doses of amoxicillin prescribed when they had seen her primary care doctor several days ago.  Is concerned about pneumonia because of his illness this past December.   Past Medical History:  Diagnosis Date  . Diabetes mellitus without complication (HCC)   . Family history of adverse reaction to anesthesia    Daughter - PONV  . Hyperlipidemia   . Hypertension     There are no active problems to display for this patient.   Past Surgical History:  Procedure Laterality Date  . cardaic stent     2004  . CATARACT EXTRACTION W/PHACO Left 12/03/2017   Procedure: CATARACT EXTRACTION PHACO AND INTRAOCULAR LENS PLACEMENT (IOC) LEFT DIABETIC;  Surgeon: Nevada Crane, MD;  Location: [facility];  Service: Ophthalmology;  Laterality: Left;  Diabetic - oral meds  . CATARACT EXTRACTION W/PHACO Right 02/18/2018   Procedure: CATARACT EXTRACTION PHACO AND INTRAOCULAR LENS  PLACEMENT (IOC) RIGHT DIABETIC;  Surgeon: Nevada Crane, MD;  Location: [facility];  Service: Ophthalmology;  Laterality: Right;  Diabetic - oral meds  . HERNIA REPAIR      Prior to Admission medications   Medication Sig Start Date End Date Taking? Authorizing Provider  aspirin 81 MG tablet Take 81 mg by mouth daily.    [provider]  atorvastatin (LIPITOR) 40 MG tablet Take 40 mg by mouth daily.    [provider]  Calcium-Magnesium-Vitamin D (CALCIUM 500 PO) Take by mouth daily.    [provider]  cetirizine (ZYRTEC) 10 MG tablet Take 10 mg by mouth daily.    [provider]  Cholecalciferol 10000 units TABS Take by mouth daily.    [provider]  Coenzyme Q10 (CO Q-10) 200 MG CAPS Take by mouth daily.    [provider]  Cyanocobalamin (VITAMIN B-12 PO) Take by mouth daily.    [provider]  lisinopril-hydrochlorothiazide (PRINZIDE,ZESTORETIC) 10-12.5 MG tablet Take 1 tablet by mouth daily.    [provider]  metFORMIN (GLUCOPHAGE) 500 MG tablet Take 500 mg by mouth daily with breakfast.    [provider]  Misc Natural Products (OSTEO BI-FLEX TRIPLE STRENGTH PO) Take 2 tablets by mouth daily.    [provider]  Multiple Vitamin (MULTIVITAMIN) tablet Take 1 tablet by mouth daily.    [provider]  naproxen (EC NAPROSYN) 500 MG EC tablet Take 1 tablet (500 mg total)  by mouth 2 (two) times daily with a meal. Patient not taking: Reported on 12/03/2017 09/17/15   Menshew, Charlesetta Ivory, PA-C  OMEGA-3 FATTY ACIDS PO Take by mouth daily. 340 - 1000    [provider]  omeprazole (PRILOSEC) 20 MG capsule Take 20 mg by mouth daily.    [provider]  sildenafil (REVATIO) 20 MG tablet Take 20 mg by mouth daily as needed.    [provider]  tiZANidine (ZANAFLEX) 2 MG tablet Take by mouth 3 (three) times daily as needed for muscle spasms.    [provider]  traMADol (ULTRAM) 50 MG tablet Take 1 tablet (50 mg total) by mouth 2 (two) times daily. Patient not taking: Reported on 12/03/2017 09/17/15   Menshew, Charlesetta Ivory, PA-C    Allergies Patient has no known allergies.  No family history on file.  Social History Social History   Tobacco Use  . Smoking status: Never Smoker  . Smokeless tobacco: Never Used  Substance Use Topics  . Alcohol use: No  . Drug use: Not on file    Review of Systems  Constitutional: Fever Eyes: No visual changes. ENT: No sore throat. Cardiovascular: Denies chest pain. Respiratory: As above Gastrointestinal: No abdominal pain.  No nausea, no vomiting.  No diarrhea.  No constipation. Genitourinary: Negative for dysuria. Musculoskeletal: Negative for back pain. Skin: Negative for rash. Neurological: Negative for headaches, focal weakness or numbness.   ____________________________________________   PHYSICAL EXAM:  VITAL SIGNS: ED Triage Vitals  Enc Vitals Group     BP 03/21/18 2044 132/62     Pulse Rate 03/21/18 2044 (!) 113     Resp 03/21/18 2044 20     Temp 03/21/18 2044 98.9 F (37.2 C)     Temp Source 03/21/18 2044 Oral     SpO2 03/21/18 2044 94 %     Weight 03/21/18 2043 155 lb (70.3 kg)     Height --      Head Circumference --      Peak Flow --      Pain Score 03/21/18 2043 0     Pain Loc --      Pain Edu? --      Excl. in GC? --     Constitutional: Alert and oriented. Well appearing and in no acute distress.  Patient with wet sounding cough audible several times throughout our interview and exam. Eyes: Conjunctivae are normal.  Head: Atraumatic. Nose: No congestion/rhinnorhea. Mouth/Throat: Mucous membranes are moist.  Neck: No stridor.   Cardiovascular: Normal rate, regular rhythm. Grossly normal heart sounds.  Heart rate of 94-97 bpm at the bedside while I am in the room. Respiratory: Normal respiratory effort.  No retractions. Lungs  CTAB. Gastrointestinal: Soft and nontender. No distention. No CVA tenderness. Musculoskeletal: No lower extremity tenderness nor edema.  No joint effusions. Neurologic:  Normal speech and language. No gross focal neurologic deficits are appreciated. Skin:  Skin is warm, dry and intact. No rash noted. Psychiatric: Mood and affect are normal. Speech and behavior are normal.  ____________________________________________   LABS (all labs ordered are listed, but only abnormal results are displayed)  Labs Reviewed  BASIC METABOLIC PANEL - Abnormal; Notable for the following components:      Result Value   Chloride 97 (*)    Glucose, Bld 200 (*)    BUN 27 (*)    All other components within normal limits  CBC - Abnormal; Notable for the following components:  WBC 11.4 (*)    All other components within normal limits  TROPONIN I   ____________________________________________  EKG  ED ECG REPORT I, Arelia Longestavid M Circe Chilton, the attending physician, personally viewed and interpreted this ECG.   Date: 03/22/2018  EKG Time: 2046  Rate: 111  Rhythm: sinus tachycardia  Axis: Normal  Intervals:none  ST&T Change: No ST segment elevation or depression.  No abnormal T wave inversion.  ____________________________________________  RADIOLOGY  No acute finding on the chest x-ray ____________________________________________   PROCEDURES  Procedure(s) performed:   Procedures  Critical Care performed:   ____________________________________________   INITIAL IMPRESSION / ASSESSMENT AND PLAN / ED COURSE  Pertinent labs & imaging results that were available during my care of the patient were reviewed by me and considered in my medical decision making (see chart for details).  Differential includes, but is not limited to, viral syndrome, bronchitis including COPD exacerbation, pneumonia, reactive airway disease including asthma, CHF including exacerbation with or without  pulmonary/interstitial edema, pneumothorax, ACS, thoracic trauma, and pulmonary embolism. As part of my medical decision making, I reviewed the following data within the electronic MEDICAL RECORD NUMBER Notes from prior ED visits  Patient with normal heart rate on my exam.  Clear lungs on exam as well with reassuring chest x-ray.  I recommended that the patient drink plenty of fluids at home during his illness and use Tylenol as needed.  I will also change his antibiotic from amoxicillin to doxycycline for more broad-spectrum coverage to prevent a possibly developing pneumonia.  I advised him to stop taking his amoxicillin.  He is understanding of this plan and willing to comply.  Patient out of the window for Tamiflu.  No body aches at this time.  No fever at this time and no recent antipyretics. ____________________________________________   FINAL CLINICAL IMPRESSION(S) / ED DIAGNOSES  URI  NEW MEDICATIONS STARTED DURING THIS VISIT:  New Prescriptions   No medications on file     Note:  This document was prepared using Dragon voice recognition software and may include unintentional dictation errors.     Myrna BlazerSchaevitz, Aviel Davalos Matthew, MD 03/22/18 [phone]

## 2019-07-09 IMAGING — CR DG CHEST 2V
2 series · 2 of 2 positions shown · non-contrast
Comparison: Chest radiograph dated 04/27/2012

CLINICAL DATA: Seven 7-year-old male with tachycardia.

EXAM:
CHEST - 2 VIEW

[chest pa]
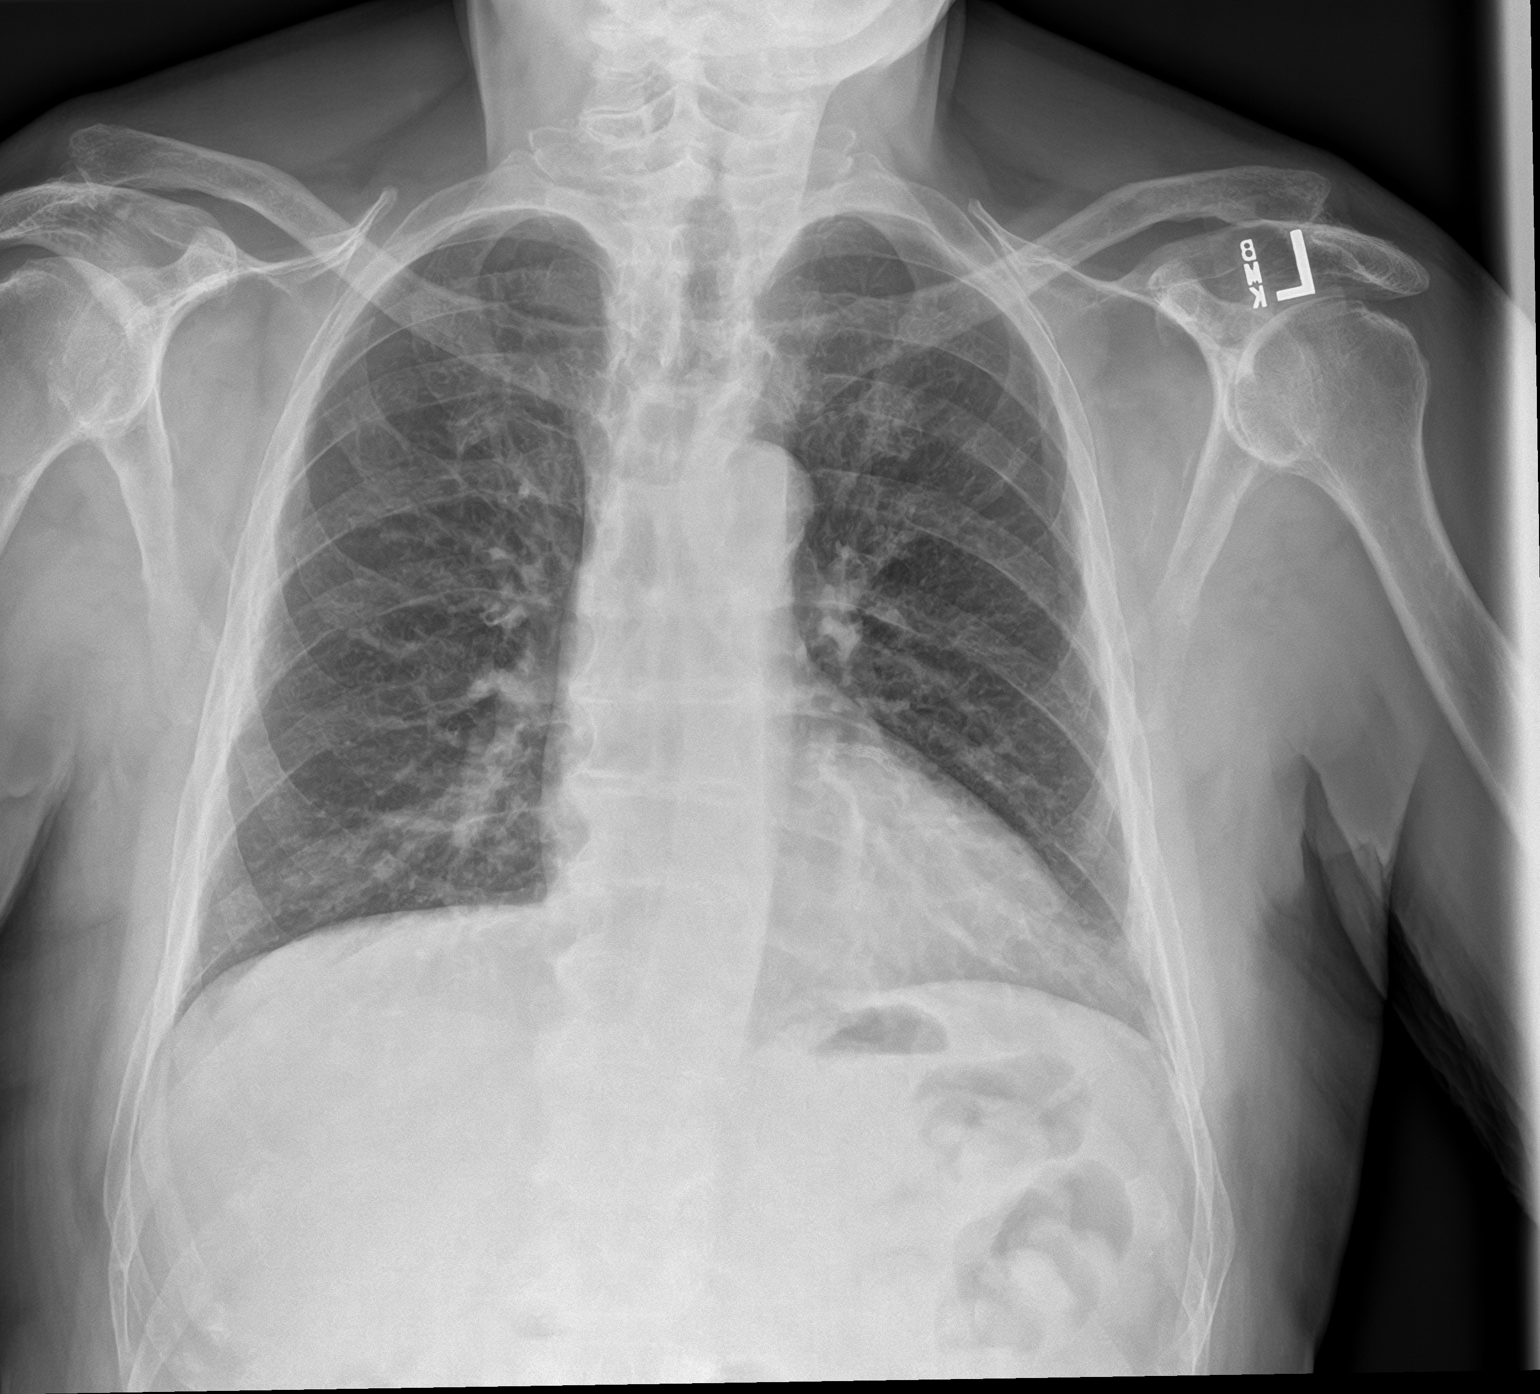

[chest lat]
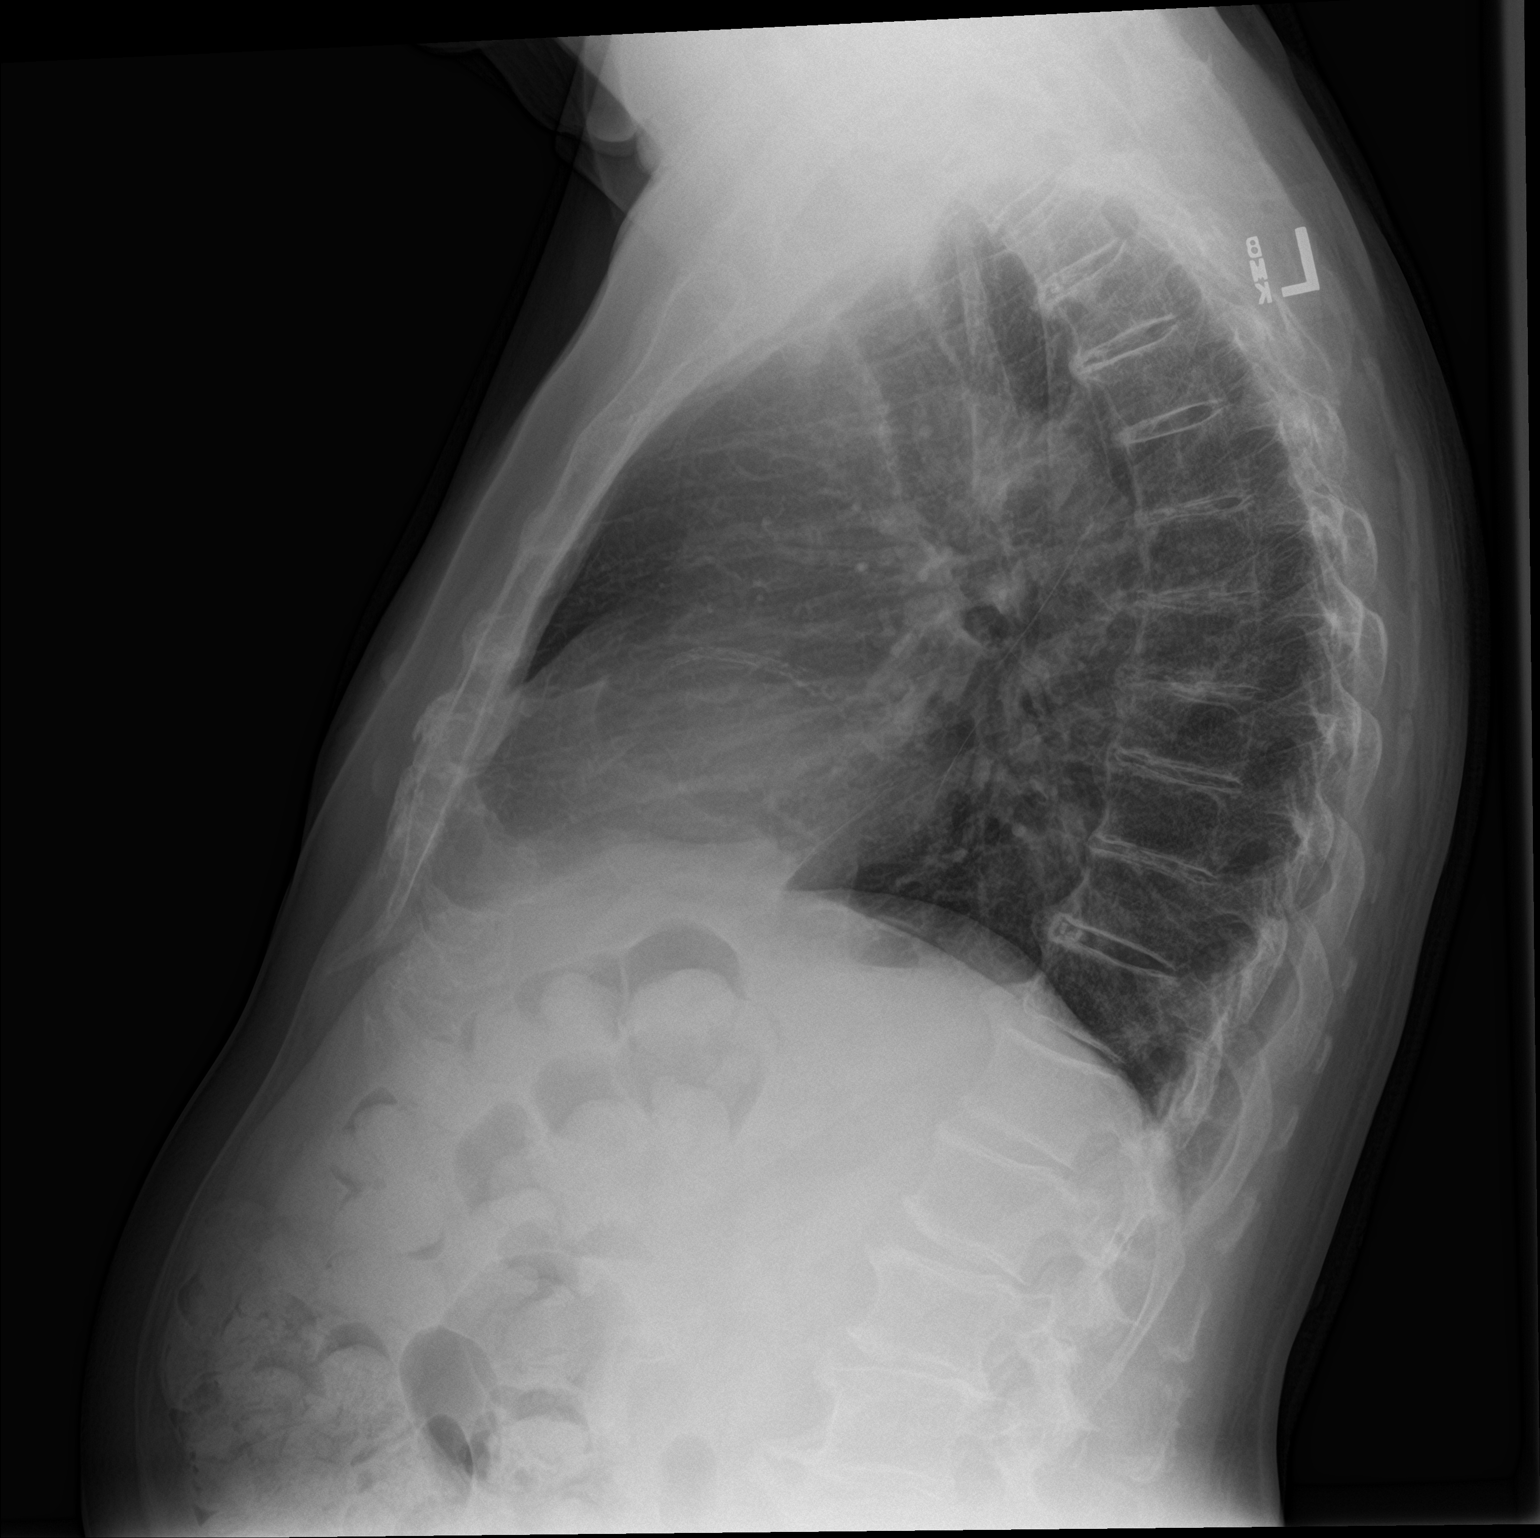

[2 of 2 positions shown; findings below may reference images not displayed]

FINDINGS: Mild emphysema and bronchitic changes. No focal consolidation,
pleural effusion, or pneumothorax. The cardiac silhouette is within
normal limits. There is coronary vascular calcification. Osteopenia
with degenerative changes of the spine. No acute osseous pathology.
IMPRESSION: No active cardiopulmonary disease.

## 2020-01-15 ENCOUNTER — Ambulatory Visit: Payer: Medicare Other | Attending: Internal Medicine

## 2020-01-15 DIAGNOSIS — Z23 Encounter for immunization: Secondary | ICD-10-CM

## 2020-01-15 NOTE — Progress Notes (Signed)
   Covid-19 Vaccination Clinic  Name:  Clayton Parker    MRN: [id number] DOB: 1941-06-21  01/15/2020  [name] was observed post Covid-19 immunization for 15 minutes without incidence. He was provided with Vaccine Information Sheet and instruction to access the V-Safe system.   [name] was instructed to call 911 with any severe reactions post vaccine: Marland Kitchen Difficulty breathing  . Swelling of your face and throat  . A fast heartbeat  . A bad rash all over your body  . Dizziness and weakness    Immunizations Administered    Name Date Dose VIS Date Route   Pfizer COVID-19 Vaccine 01/15/2020 12:34 PM 0.3 mL 12/04/2019 Intramuscular   Manufacturer: [employer], [employer]   Lot: [id number]   NDC: [address]-9

## 2020-02-02 ENCOUNTER — Ambulatory Visit: Payer: Medicare Other | Attending: Internal Medicine

## 2020-02-02 DIAGNOSIS — Z23 Encounter for immunization: Secondary | ICD-10-CM | POA: Insufficient documentation

## 2020-02-02 NOTE — Progress Notes (Signed)
   Covid-19 Vaccination Clinic  Name:  Clayton Parker    MRN: [id number] DOB: 1941/12/24  02/02/2020  Clayton Parker was observed post Covid-19 immunization for 15 minutes without incidence. He was provided with Vaccine Information Sheet and instruction to access the V-Safe system.   Clayton Parker was instructed to call 911 with any severe reactions post vaccine: Marland Kitchen Difficulty breathing  . Swelling of your face and throat  . A fast heartbeat  . A bad rash all over your body  . Dizziness and weakness    Immunizations Administered    Name Date Dose VIS Date Route   Pfizer COVID-19 Vaccine 02/02/2020 11:52 AM 0.3 mL 12/04/2019 Intramuscular   Manufacturer: [employer], [employer]   Lot: [id number]   NDC: [address]-3

## 2020-10-10 DIAGNOSIS — M159 Polyosteoarthritis, unspecified: Secondary | ICD-10-CM | POA: Insufficient documentation

## 2021-10-16 DIAGNOSIS — D649 Anemia, unspecified: Secondary | ICD-10-CM | POA: Insufficient documentation

## 2022-01-07 DIAGNOSIS — M1611 Unilateral primary osteoarthritis, right hip: Secondary | ICD-10-CM | POA: Insufficient documentation

## 2022-03-11 NOTE — Discharge Instructions (Signed)
Instructions after Total Hip Replacement     Clayton Parker, Jr., M.D.     Dept. of Orthopaedics & Sports Medicine  [facility]  [address]  [place], [place]  [address]  Phone: [phone]   Fax: [phone]    DIET: . Drink plenty of non-alcoholic fluids. . Resume your normal diet. Include foods high in fiber.  ACTIVITY:  . You may use crutches or a walker with weight-bearing as tolerated, unless instructed otherwise. . You may be weaned off of the walker or crutches by your Physical Therapist.  . Do NOT reach below the level of your knees or cross your legs until allowed.    . Continue doing gentle exercises. Exercising will reduce the pain and swelling, increase motion, and prevent muscle weakness.   . Please continue to use the TED compression stockings for 6 weeks. You may remove the stockings at night, but should reapply them in the morning. . Do not drive or operate any equipment until instructed.  WOUND CARE:  . Continue to use ice packs periodically to reduce pain and swelling. . Keep the incision clean and dry. . You may bathe or shower after the staples are removed at the first office visit following surgery.  MEDICATIONS: . You may resume your regular medications. . Please take the pain medication as prescribed on the medication. . Do not take pain medication on an empty stomach. . You have been given a prescription for a blood thinner to prevent blood clots. Please take the medication as instructed. (NOTE: After completing a 2 week course of Lovenox, take one Enteric-coated aspirin once a day.) . Pain medications and iron supplements can cause constipation. Use a stool softener (Senokot or Colace) on a daily basis and a laxative (dulcolax or miralax) as needed. . Do not drive or drink alcoholic beverages when taking pain medications.  CALL THE OFFICE FOR: . Temperature above 101 degrees . Excessive bleeding or drainage on the dressing. . Excessive  swelling, coldness, or paleness of the toes. . Persistent nausea and vomiting.  FOLLOW-UP:  . You should have an appointment to return to the office in 6 weeks after surgery. . Arrangements have been made for continuation of Physical Therapy (either home therapy or outpatient therapy).     [facility] Department Directory         [email or web address]       [email or web address]          Cardiology  Appointments: [place] - [phone] [place] - [phone]  Endocrinology  Appointments: [place] - [phone] [place] - [phone]  Gastroenterology  Appointments: [place] - [phone] [place] - [phone]        General Surgery   Appointments: [place] - [phone]  Internal Medicine/Family Medicine  Appointments: [place] - [phone] [place] - [phone] [place] - [phone]  Metabolic and Weigh Loss Surgery  Appointments: [place] - [phone]        Neurology  Appointments: [place] - [phone] [place] - [phone]  Neurosurgery  Appointments: [place] - [phone]  Obstetrics & Gynecology  Appointments: [place] - [phone] [place] - [phone]        Pediatrics  Appointments: [place] - [phone] [place] - [phone]  Physiatry  Appointments: [place] -[phone]  Physical Therapy  Appointments: [place] - [phone] [place] - [phone]        Podiatry  Appointments: [place] - [phone] [place] - [phone]  Pulmonology  Appointments: [place] - [phone]  Rheumatology  Appointments: [place] - [phone]        [place] Location: [facility]  [address] [place], [place]  [address]  [place] Location: [facility] [address] [place], [place]  [address]  [place] Location: [facility] [address] [place],   [address]    

## 2022-03-21 ENCOUNTER — Other Ambulatory Visit: Payer: Self-pay

## 2022-03-21 ENCOUNTER — Encounter
Admission: RE | Admit: 2022-03-21 | Discharge: 2022-03-21 | Disposition: A | Discharge status: Home / Self Care | Payer: Medicare Other | Source: Ambulatory Visit | Attending: Orthopedic Surgery | Admitting: Orthopedic Surgery

## 2022-03-21 VITALS — BP 170/65 | HR 87 | Resp 16 | Ht 64.5 in

## 2022-03-21 DIAGNOSIS — E119 Type 2 diabetes mellitus without complications: Secondary | ICD-10-CM | POA: Insufficient documentation

## 2022-03-21 DIAGNOSIS — Z01818 Encounter for other preprocedural examination: Secondary | ICD-10-CM | POA: Insufficient documentation

## 2022-03-21 DIAGNOSIS — M1611 Unilateral primary osteoarthritis, right hip: Secondary | ICD-10-CM | POA: Diagnosis not present

## 2022-03-21 DIAGNOSIS — Z01812 Encounter for preprocedural laboratory examination: Secondary | ICD-10-CM

## 2022-03-21 HISTORY — DX: Gastro-esophageal reflux disease without esophagitis: K21.9

## 2022-03-21 HISTORY — DX: Atherosclerotic heart disease of native coronary artery without angina pectoris: I25.10

## 2022-03-21 HISTORY — DX: Unspecified osteoarthritis, unspecified site: M19.90

## 2022-03-21 HISTORY — DX: Chronic kidney disease, unspecified: N18.9

## 2022-03-21 LAB — COMPREHENSIVE METABOLIC PANEL
ALT: 27 U/L (ref 0–44)
AST: 25 U/L (ref 15–41)
Albumin: 4 g/dL (ref 3.5–5.0)
Alkaline Phosphatase: 108 U/L (ref 38–126)
Anion gap: 7 (ref 5–15)
BUN: 34 mg/dL — ABNORMAL HIGH (ref 8–23)
CO2: 28 mmol/L (ref 22–32)
Calcium: 9.6 mg/dL (ref 8.9–10.3)
Chloride: 105 mmol/L (ref 98–111)
Creatinine, Ser: 1.26 mg/dL — ABNORMAL HIGH (ref 0.61–1.24)
GFR, Estimated: 57 mL/min — ABNORMAL LOW (ref >60)
Glucose, Bld: 168 mg/dL — ABNORMAL HIGH (ref 70–99)
Potassium: 4.3 mmol/L (ref 3.5–5.1)
Sodium: 140 mmol/L (ref 135–145)
Total Bilirubin: 0.6 mg/dL (ref 0.3–1.2)
Total Protein: 7.5 g/dL (ref 6.5–8.1)

## 2022-03-21 LAB — URINALYSIS, ROUTINE W REFLEX MICROSCOPIC
Bilirubin Urine: NEGATIVE
Glucose, UA: 50 mg/dL — AB
Hgb urine dipstick: NEGATIVE
Ketones, ur: NEGATIVE mg/dL
Leukocytes,Ua: NEGATIVE
Nitrite: NEGATIVE
Protein, ur: NEGATIVE mg/dL
Specific Gravity, Urine: 1.019 (ref 1.005–1.030)
pH: 5 (ref 5.0–8.0)

## 2022-03-21 LAB — SURGICAL PCR SCREEN
MRSA, PCR: NEGATIVE
Staphylococcus aureus: NEGATIVE

## 2022-03-21 LAB — SEDIMENTATION RATE: Sed Rate: 23 mm/hr — ABNORMAL HIGH (ref 0–20)

## 2022-03-21 LAB — CBC
HCT: 37 % — ABNORMAL LOW (ref 39.0–52.0)
Hemoglobin: 12.2 g/dL — ABNORMAL LOW (ref 13.0–17.0)
MCH: 30.7 pg (ref 26.0–34.0)
MCHC: 33 g/dL (ref 30.0–36.0)
MCV: 93 fL (ref 80.0–100.0)
Platelets: 300 10*3/uL (ref 150–400)
RBC: 3.98 MIL/uL — ABNORMAL LOW (ref 4.22–5.81)
RDW: 12.8 % (ref 11.5–15.5)
WBC: 8.4 10*3/uL (ref 4.0–10.5)
nRBC: 0 % (ref 0.0–0.2)

## 2022-03-21 LAB — TYPE AND SCREEN
ABO/RH(D): O POS
Antibody Screen: NEGATIVE

## 2022-03-21 LAB — C-REACTIVE PROTEIN: CRP: 0.7 mg/dL (ref <1.0)

## 2022-03-21 NOTE — Patient Instructions (Addendum)
Your procedure is scheduled on: 04/02/22 - Monday ?Report to the Registration Desk on the 1st floor of the [facility]. ?To find out your arrival time, please call [phone] between 1PM - 3PM on: 03/30/22 - Friday ? ?REMEMBER: ?Instructions that are not followed completely may result in serious medical risk, up to and including death; or upon the discretion of your surgeon and anesthesiologist your surgery may need to be rescheduled. ? ?Do not eat food after midnight the night before surgery.  ?No gum chewing, lozengers or hard candies. ? ?You may however, drink CLEAR liquids up to 2 hours before you are scheduled to arrive for your surgery. Do not drink anything within 2 hours of your scheduled arrival time. ? ?Type 1 and Type 2 diabetics should only drink water. ? ?In addition, your doctor has ordered for you to drink the provided  ?Gatorade G2 ?Drinking this carbohydrate drink up to two hours before surgery helps to reduce insulin resistance and improve patient outcomes. Please complete drinking 2 hours prior to scheduled arrival time. ? ?TAKE THESE MEDICATIONS THE MORNING OF SURGERY WITH A SIP OF WATER: ? ?- fluticasone (FLONASE) 50 MCG/ACT nasal spray ?- omeprazole (PRILOSEC OTC) 20 MG tablet, (take one the night before and one on the morning of surgery - helps to prevent nausea after surgery.) ? ? ?Stop taking metFORMIN (GLUCOPHAGE) 500 MG tablet beginning 03/31/22, may resume taking the day after your surgery. ? ?Follow recommendations from Cardiologist, Pulmonologist or PCP regarding stopping Aspirin, Coumadin, Plavix, Eliquis, Pradaxa, or Pletal. Do Not stop Aspirin before your surgery, but do not take the day of your surgery. ? ?One week prior to surgery: Stop taking beginning 03/26/22 : ?Stop Anti-inflammatories (NSAIDS) such as Advil, Aleve, Ibuprofen, Motrin, Naproxen, Naprosyn and Aspirin based products such as Excedrin, Goodys Powder, BC Powder. ? ?Stop ANY OVER THE COUNTER supplements until  after surgery beginning 03/26/22 : AIRBORNE PO, Multiple Vitamin , OSTEO BI-FLEX , CHEWABLE CALCIUM/D, Coenzyme Q10 .  ? ?You may take Tylenol if needed for pain up until the day of surgery. ? ?No Alcohol for 24 hours before or after surgery. ? ?No Smoking including e-cigarettes for 24 hours prior to surgery.  ?No chewable tobacco products for at least 6 hours prior to surgery.  ?No nicotine patches on the day of surgery. ? ?Do not use any "recreational" drugs for at least a week prior to your surgery.  ?Please be advised that the combination of cocaine and anesthesia may have negative outcomes, up to and including death. ?If you test positive for cocaine, your surgery will be cancelled. ? ?On the morning of surgery brush your teeth with toothpaste and water, you may rinse your mouth with mouthwash if you wish. ?Do not swallow any toothpaste or mouthwash. ? ?Use CHG Soap or wipes as directed on instruction sheet. ? ?Do not wear jewelry, make-up, hairpins, clips or nail polish. ? ?Do not wear lotions, powders, or perfumes.  ? ?Do not shave body from the neck down 48 hours prior to surgery just in case you cut yourself which could leave a site for infection.  ?Also, freshly shaved skin may become irritated if using the CHG soap. ? ?Contact lenses, hearing aids and dentures may not be worn into surgery. ? ?Do not bring valuables to the hospital. [facility] is not responsible for any missing/lost belongings or valuables.  ? ?Notify your doctor if there is any change in your medical condition (cold, fever, infection). ? ?Wear comfortable clothing (  specific to your surgery type) to the hospital. ? ?After surgery, you can help prevent lung complications by doing breathing exercises.  ?Take deep breaths and cough every 1-2 hours. Your doctor may order a device called an Incentive Spirometer to help you take deep breaths. ?When coughing or sneezing, hold a pillow firmly against your incision with both hands. This is called  ?splinting.? Doing this helps protect your incision. It also decreases belly discomfort. ? ?If you are being admitted to the hospital overnight, leave your suitcase in the car. ?After surgery it may be brought to your room. ? ?If you are being discharged the day of surgery, you will not be allowed to drive home. ?You will need a responsible adult (18 years or older) to drive you home and stay with you that night.  ? ?If you are taking public transportation, you will need to have a responsible adult (18 years or older) with you. ?Please confirm with your physician that it is acceptable to use public transportation.  ? ?Please call the Pre-admissions Testing Dept. at [phone] if you have any questions about these instructions. ? ?Surgery Visitation Policy: ? ?Patients undergoing a surgery or procedure may have two family members or support persons with them as long as the person is not COVID-19 positive or experiencing its symptoms.  ? ?Inpatient Visitation:   ? ?Visiting hours are 7 a.m. to 8 p.m. ?Up to four visitors are allowed at one time in a patient room, including children. The visitors may rotate out with other people during the day. One designated support person (adult) may remain overnight.  ?

## 2022-03-29 ENCOUNTER — Other Ambulatory Visit: Admission: RE | Admit: 2022-03-29 | Payer: Medicare Other | Source: Ambulatory Visit

## 2022-04-01 ENCOUNTER — Encounter: Payer: Self-pay | Admitting: Orthopedic Surgery

## 2022-04-01 DIAGNOSIS — K219 Gastro-esophageal reflux disease without esophagitis: Secondary | ICD-10-CM | POA: Insufficient documentation

## 2022-04-01 DIAGNOSIS — I2510 Atherosclerotic heart disease of native coronary artery without angina pectoris: Secondary | ICD-10-CM | POA: Insufficient documentation

## 2022-04-01 DIAGNOSIS — E1122 Type 2 diabetes mellitus with diabetic chronic kidney disease: Secondary | ICD-10-CM | POA: Insufficient documentation

## 2022-04-01 DIAGNOSIS — N529 Male erectile dysfunction, unspecified: Secondary | ICD-10-CM | POA: Insufficient documentation

## 2022-04-01 DIAGNOSIS — Z85828 Personal history of other malignant neoplasm of skin: Secondary | ICD-10-CM | POA: Insufficient documentation

## 2022-04-01 NOTE — H&P (Signed)
ORTHOPAEDIC HISTORY & PHYSICAL ?[name], [place] - 03/22/2022 8:45 AM EDT ?Formatting of this note is different from the original. ?[facility] ?Chief Complaint:  ? ?Chief Complaint  ?Patient presents with  ? Hip Pain  ?H & P RIGHT HIP  ? ?History of Present Illness:  ? ?Clayton Parker is a 81 y.o. male that presents to clinic today for his preoperative history and evaluation. Patient presents with his wife. The patient is scheduled to undergo a right total hip arthroplasty on 04/02/22 by Dr. Marry Guan. His pain began approximately 1 year ago. The pain is located in the right hip and groin. He describes his pain as worse with weightbearing. He reports associated loss of right hip range of motion and subsequent difficulty putting on and removing shoes and socks. He denies associated numbness or tingling.  ? ?The patient's symptoms have progressed to the point that they decrease his quality of life. The patient has previously undergone conservative treatment including NSAIDS and activity modification without adequate control of his symptoms. ? ?Denies history of DVT, lumbar surgery. Does have remote history of stent placement and is followed by Dr. Ubaldo Glassing. ? ?Wife will be available to help postoperatively. ? ?Past Medical, Surgical, Family, Social History, Allergies, Medications:  ? ?Past Medical History:  ?Past Medical History:  ?Diagnosis Date  ? Allergic state  ? Arthritis  ? Basal cell carcinoma  ?personal  ? Basal cell carcinoma  ? CAD (coronary artery disease)  ?a. myoview revealed anterolateral ischemia. b. cardiac catheterization revealed 80% prox LAD lesion, 100% OM1. c. status post placement of a 2.5 x 28 mm. Cypher DES stent in the prox LAD followed by a 2.5 x 28 cm. Cypher DES in the mid RCA.  ? Cancer (CMS-HCC)  ?basal  ? Diabetes mellitus type 2, uncomplicated (CMS-HCC)  ? DM2 (diabetes mellitus, type 2) (CMS-HCC) 11/05/2012  ? ED (erectile dysfunction)   ? GERD (gastroesophageal reflux disease)  ? Heart disease 2004  ? Hematologic abnormality 2004  ?Took for 6 months after they inserted a stent  ? Hemorrhoids  ? Hyperlipidemia  ? Hypertension  ? Primary osteoarthritis of right hip 01/07/2022  ? Sleep apnea  ? Vitamin D deficiency  ? ?Past Surgical History:  ?Past Surgical History:  ?Procedure Laterality Date  ? basal cell  ? cardiac stent  ? CATARACT EXTRACTION  ?left eye 12/03/2017, right eye 02/18/18  ? HERNIA REPAIR  ?groin  ? PILONIDAL CYST / SINUS EXCISION  ? ?Current Medications:  ?Current Outpatient Medications  ?Medication Sig Dispense Refill  ? ACCU-CHEK SMARTVIEW TEST STRIP test strip TEST ONCE DAILY 100 strip 3  ? aspirin 81 mg tablet Take 81 mg by mouth daily.  ? atorvastatin (LIPITOR) 80 MG tablet TAKE 1 TABLET BY MOUTH ONCE DAILY 90 tablet 2  ? blood glucose diagnostic (ACCU-CHEK SMARTVIEW TEST STRIP) test strip once daily Use as instructed. 100 strip 3  ? CALCIUM CARBONATE (CALCIUM 500 ORAL) Take 2 capsules by mouth once daily  ? cetirizine (ZYRTEC) 10 mg capsule Take 10 mg by mouth once daily.  ? coenzyme Q10 10 mg capsule Take 10 mg by mouth once daily.  ? cyanocobalamin (VITAMIN B12) 100 MCG tablet Take 100 mcg by mouth every other day  ? FA/mv,Ca,iron,min/lycopene/lut (MULTIVITAL ORAL) Take by mouth  ? fluticasone propionate (FLONASE) 50 mcg/actuation nasal spray Place 2 sprays into both nostrils once daily  ? glucosam-chon-msm1-C-mang-bosw 750 mg-644 mg- 30 mg-1 mg Tab Take 2 tablets  by mouth once daily.  ? ibuprofen (MOTRIN) 200 MG tablet Take 600 mg by mouth 2 (two) times daily as needed  ? lancets (ACCU-CHEK SOFTCLIX LANCETS) TEST ONCE A DAY. 100 each 3  ? metFORMIN (GLUCOPHAGE) 500 MG tablet TAKE ONE-HALF TABLET BY MOUTH TWICE DAILY WITH MEALS 90 tablet 3  ? multivit-min/vit C/herb no.124 (AIRBORNE, ASCORBIC ACID, ORAL) Take 1 tablet by mouth once daily  ? omeprazole (PRILOSEC OTC) 20 MG EC tablet Take 20 mg by mouth every other day  ?  sildenafil (REVATIO) 20 mg tablet TAKE 1 TABLET BY MOUTH AS DIRECTED 20 tablet 1  ? blood-glucose meter (ACCU-CHEK NANO) Misc 1 each by XX route as directed. 1 each 0  ? IBUPROFEN ORAL Take by mouth  ? lisinopriL-hydrochlorothiazide (ZESTORETIC) 10-12.5 mg tablet TAKE 1 TABLET BY MOUTH ONCE DAILY 90 tablet 3  ? ?No current facility-administered medications for this visit.  ? ?Allergies: No Known Allergies ? ?Social History:  ?Social History  ? ?Socioeconomic History  ? Marital status: Married  ?Spouse name: Bevely Palmer  ? Number of children: 2  ? Years of education: 33  ? Highest education level: High school graduate  ?Tobacco Use  ? Smoking status: Never  ? Smokeless tobacco: Never  ?Vaping Use  ? Vaping Use: Never used  ?Substance and Sexual Activity  ? Alcohol use: Not Currently  ?Comment: Ill take a drink on special occasions  ? Drug use: No  ? Sexual activity: Yes  ?Partners: Female  ?Birth control/protection: None  ?Social History Narrative  ?Retired from [employer], high school education, no alcohol, no tobacco, married to [place]. Previously lived in [place]  ? ?As of 11/27/16 has been married for 52 years.  ? ?10/16/2021  ?Military Service: None  ?Likes/Enjoys/What fills your day?: Narrative  ?Home: Three Story - split level 6 down and 10 steps up  ?Your Bedrooms is on: Third Level  ?Fewest Steps to enter the home: 3  ?Other persons in the home: wife  ?Pets: none  ?Medical equipment you use daily: Blood Pressure Machine and Glucometer  ?Medical equipment available in the home: Cane, Single point  ?Dental: no significant dental problems. Last screening Date: May 2022. Screening is: Up to date.  ?Vision: Denies any issues with vision. Last screening Date: March 2022. Screening is: Up to date  ?Hearing: Hard time hearing the TV.  ?Dermatology: Denies areas of concern.  ? ?Family History:  ?Family History  ?Problem Relation Age of Onset  ? Mental illness Mother  ? Dementia Mother  ? Depression Mother  ? No Known  Problems Father  ? ?Review of Systems:  ? ?A 10+ ROS was performed, reviewed, and the pertinent orthopaedic findings are documented in the HPI.  ? ?Physical Examination:  ? ?BP (!) 150/90 (BP Location: Left upper arm, Patient Position: Sitting, BP Cuff Size: Adult)  Ht 163.8 cm (5' 4.5")  Wt 67.7 kg (149 lb 3.2 oz)  BMI 25.21 kg/m?  ? ?Patient is a well-developed, well-nourished male in no acute distress. Patient has normal mood and affect. Patient is alert and oriented to person, place, and time.  ? ?HEENT: Atraumatic, normocephalic. Pupils equal and reactive to light. Extraocular motion intact. Noninjected sclera. ? ?Cardiovascular: Regular rate and rhythm, with no murmurs, rubs, or gallops. Distal pulses palpable. ? ?Respiratory: Lungs clear to auscultation bilaterally.  ? ?Right Hip: ?Pelvic tilt: Negative ?Limb lengths: Equal with the patient standing ?Soft tissue swelling: Negative ?Erythema: Negative ?Crepitance: Negative ?Tenderness: Greater trochanter  is nontender to palpation. Moderate pain is elicited by axial compression or extremes of rotation. ?Atrophy: No atrophy. Fair to good hip flexor and abductor strength. ?Range of Motion: EXT/FLEX: 0/0/95 ADD/ABD: 20/0/20 IR/ER: 0/0/20  ? ?Able to plantarflex and dorsiflex the right ankle. Able to flex and extend the toes. ? ?Sensation is intact over the saphenous, lateral cutaneous, superficial fibular, and deep fibular nerve distributions. ? ?Tests Performed/Reviewed:  ?X-rays ? ?Anteroposterior view of the pelvis was anteroposterior and lateral views of the right hip were obtained. Images reveal complete loss of superior femoral acetabular joint space with bone-on-bone contact and osteophyte formation noted. ? ?I personally ordered and interpreted today's x-rays. ? ?Impression:  ? ?ICD-10-CM  ?1. Primary osteoarthritis of right hip M16.11  ? ?Plan:  ? ?The patient has end-stage degenerative changes of the right hip. It was explained to the patient that the  condition is progressive in nature. Having failed conservative treatment, the patient has elected to proceed with a total joint arthroplasty. The patient will undergo a total joint arthroplasty with Dr. Marry Guan

## 2022-04-02 ENCOUNTER — Inpatient Hospital Stay: Payer: Medicare Other | Admitting: Urgent Care

## 2022-04-02 ENCOUNTER — Encounter: Payer: Self-pay | Admitting: Orthopedic Surgery

## 2022-04-02 ENCOUNTER — Observation Stay: Payer: Medicare Other

## 2022-04-02 ENCOUNTER — Other Ambulatory Visit: Payer: Self-pay

## 2022-04-02 ENCOUNTER — Inpatient Hospital Stay: Payer: Medicare Other | Admitting: Anesthesiology

## 2022-04-02 ENCOUNTER — Encounter
Admission: RE | Disposition: A | Discharge status: Home / Self Care | Payer: Self-pay | Source: Home / Self Care | Attending: Orthopedic Surgery

## 2022-04-02 ENCOUNTER — Inpatient Hospital Stay
Admission: RE | Admit: 2022-04-02 | Discharge: 2022-04-03 | DRG: 470 | Disposition: A | Discharge status: Home / Self Care | Payer: Medicare Other | Attending: Orthopedic Surgery | Admitting: Orthopedic Surgery

## 2022-04-02 DIAGNOSIS — Z79899 Other long term (current) drug therapy: Secondary | ICD-10-CM

## 2022-04-02 DIAGNOSIS — E7800 Pure hypercholesterolemia, unspecified: Secondary | ICD-10-CM | POA: Diagnosis present

## 2022-04-02 DIAGNOSIS — Z85828 Personal history of other malignant neoplasm of skin: Secondary | ICD-10-CM

## 2022-04-02 DIAGNOSIS — G4730 Sleep apnea, unspecified: Secondary | ICD-10-CM

## 2022-04-02 DIAGNOSIS — Z808 Family history of malignant neoplasm of other organs or systems: Secondary | ICD-10-CM

## 2022-04-02 DIAGNOSIS — N1831 Chronic kidney disease, stage 3a: Secondary | ICD-10-CM | POA: Diagnosis present

## 2022-04-02 DIAGNOSIS — E119 Type 2 diabetes mellitus without complications: Secondary | ICD-10-CM

## 2022-04-02 DIAGNOSIS — Z96641 Presence of right artificial hip joint: Secondary | ICD-10-CM

## 2022-04-02 DIAGNOSIS — N529 Male erectile dysfunction, unspecified: Secondary | ICD-10-CM | POA: Diagnosis present

## 2022-04-02 DIAGNOSIS — Z7982 Long term (current) use of aspirin: Secondary | ICD-10-CM

## 2022-04-02 DIAGNOSIS — K219 Gastro-esophageal reflux disease without esophagitis: Secondary | ICD-10-CM | POA: Diagnosis present

## 2022-04-02 DIAGNOSIS — M159 Polyosteoarthritis, unspecified: Secondary | ICD-10-CM | POA: Diagnosis present

## 2022-04-02 DIAGNOSIS — E559 Vitamin D deficiency, unspecified: Secondary | ICD-10-CM | POA: Diagnosis present

## 2022-04-02 DIAGNOSIS — E1122 Type 2 diabetes mellitus with diabetic chronic kidney disease: Secondary | ICD-10-CM | POA: Diagnosis present

## 2022-04-02 DIAGNOSIS — I129 Hypertensive chronic kidney disease with stage 1 through stage 4 chronic kidney disease, or unspecified chronic kidney disease: Secondary | ICD-10-CM | POA: Diagnosis present

## 2022-04-02 DIAGNOSIS — M1611 Unilateral primary osteoarthritis, right hip: Principal | ICD-10-CM

## 2022-04-02 DIAGNOSIS — I2510 Atherosclerotic heart disease of native coronary artery without angina pectoris: Secondary | ICD-10-CM | POA: Diagnosis present

## 2022-04-02 DIAGNOSIS — Z7984 Long term (current) use of oral hypoglycemic drugs: Secondary | ICD-10-CM

## 2022-04-02 HISTORY — PX: TOTAL HIP ARTHROPLASTY: SHX124

## 2022-04-02 LAB — GLUCOSE, CAPILLARY
Glucose-Capillary: 138 mg/dL — ABNORMAL HIGH (ref 70–99)
Glucose-Capillary: 168 mg/dL — ABNORMAL HIGH (ref 70–99)
Glucose-Capillary: 266 mg/dL — ABNORMAL HIGH (ref 70–99)
Glucose-Capillary: 259 mg/dL — ABNORMAL HIGH (ref 70–99)
Glucose-Capillary: 270 mg/dL — ABNORMAL HIGH (ref 70–99)

## 2022-04-02 LAB — ABO/RH: ABO/RH(D): O POS

## 2022-04-02 SURGERY — ARTHROPLASTY, HIP, TOTAL,POSTERIOR APPROACH
Anesthesia: Spinal | Site: Hip | Laterality: Right

## 2022-04-02 MED ORDER — DEXAMETHASONE SODIUM PHOSPHATE 10 MG/ML IJ SOLN
INTRAMUSCULAR | Status: AC
  Administered 2022-04-02: 8 mg via INTRAVENOUS
  Filled 2022-04-02: qty 1

## 2022-04-02 MED ORDER — OXYCODONE HCL 5 MG PO TABS
5.0000 mg | ORAL_TABLET | ORAL | Status: DC | PRN
  Administered 2022-04-02 – 2022-04-03 (×3): 5 mg via ORAL
  Filled 2022-04-02 (×2): qty 1

## 2022-04-02 MED ORDER — CEFAZOLIN SODIUM-DEXTROSE 2-4 GM/100ML-% IV SOLN
2.0000 g | INTRAVENOUS | Status: AC
  Administered 2022-04-02: 2 g via INTRAVENOUS

## 2022-04-02 MED ORDER — ACETAMINOPHEN 325 MG PO TABS: 325.0000 mg | ORAL_TABLET | Freq: Four times a day (QID) | ORAL | Status: DC | PRN

## 2022-04-02 MED ORDER — ENSURE PRE-SURGERY PO LIQD
296.0000 mL | Freq: Once | ORAL | Status: AC
  Administered 2022-04-02: 296 mL via ORAL
  Filled 2022-04-02: qty 296

## 2022-04-02 MED ORDER — FERROUS SULFATE 325 (65 FE) MG PO TABS
325.0000 mg | ORAL_TABLET | Freq: Two times a day (BID) | ORAL | Status: DC
  Administered 2022-04-03: 325 mg via ORAL
  Filled 2022-04-02: qty 1

## 2022-04-02 MED ORDER — TRANEXAMIC ACID-NACL 1000-0.7 MG/100ML-% IV SOLN
INTRAVENOUS | Status: AC
  Administered 2022-04-02: 1000 mg via INTRAVENOUS
  Filled 2022-04-02: qty 100

## 2022-04-02 MED ORDER — MIDAZOLAM HCL 5 MG/5ML IJ SOLN
INTRAMUSCULAR | Status: DC | PRN
  Administered 2022-04-02 (×2): 1 mg via INTRAVENOUS

## 2022-04-02 MED ORDER — ATORVASTATIN CALCIUM 20 MG PO TABS
80.0000 mg | ORAL_TABLET | Freq: Every day | ORAL | Status: DC
  Administered 2022-04-02 – 2022-04-03 (×2): 80 mg via ORAL
  Filled 2022-04-02 (×2): qty 4

## 2022-04-02 MED ORDER — MIDAZOLAM HCL 2 MG/2ML IJ SOLN
INTRAMUSCULAR | Status: AC
  Filled 2022-04-02: qty 2

## 2022-04-02 MED ORDER — BISACODYL 10 MG RE SUPP: 10.0000 mg | Freq: Every day | RECTAL | Status: DC | PRN

## 2022-04-02 MED ORDER — 0.9 % SODIUM CHLORIDE (POUR BTL) OPTIME
TOPICAL | Status: DC | PRN
  Administered 2022-04-02: 500 mL

## 2022-04-02 MED ORDER — ADULT MULTIVITAMIN W/MINERALS CH
1.0000 | ORAL_TABLET | Freq: Every day | ORAL | Status: DC
  Administered 2022-04-02 – 2022-04-03 (×2): 1 via ORAL
  Filled 2022-04-02 (×2): qty 1

## 2022-04-02 MED ORDER — ONDANSETRON HCL 4 MG/2ML IJ SOLN: 4.0000 mg | Freq: Once | INTRAMUSCULAR | Status: DC | PRN

## 2022-04-02 MED ORDER — CHLORHEXIDINE GLUCONATE 0.12 % MT SOLN: 15.0000 mL | Freq: Once | OROMUCOSAL | Status: AC

## 2022-04-02 MED ORDER — FLEET ENEMA 7-19 GM/118ML RE ENEM: 1.0000 | ENEMA | Freq: Once | RECTAL | Status: DC | PRN

## 2022-04-02 MED ORDER — CHLORHEXIDINE GLUCONATE 0.12 % MT SOLN
OROMUCOSAL | Status: AC
  Administered 2022-04-02: 15 mL via OROMUCOSAL
  Filled 2022-04-02: qty 15

## 2022-04-02 MED ORDER — PHENOL 1.4 % MT LIQD: 1.0000 | OROMUCOSAL | Status: DC | PRN

## 2022-04-02 MED ORDER — OXYCODONE HCL 5 MG PO TABS
ORAL_TABLET | ORAL | Status: AC
  Filled 2022-04-02: qty 1

## 2022-04-02 MED ORDER — ONDANSETRON HCL 4 MG PO TABS: 4.0000 mg | ORAL_TABLET | Freq: Four times a day (QID) | ORAL | Status: DC | PRN

## 2022-04-02 MED ORDER — SODIUM CHLORIDE 0.9 % IV BOLUS
500.0000 mL | INTRAVENOUS | Status: AC
  Administered 2022-04-02: 500 mL via INTRAVENOUS

## 2022-04-02 MED ORDER — METOCLOPRAMIDE HCL 10 MG PO TABS
10.0000 mg | ORAL_TABLET | Freq: Three times a day (TID) | ORAL | Status: DC
  Administered 2022-04-02 – 2022-04-03 (×3): 10 mg via ORAL
  Filled 2022-04-02 (×3): qty 1

## 2022-04-02 MED ORDER — BUPIVACAINE HCL (PF) 0.5 % IJ SOLN
INTRAMUSCULAR | Status: DC | PRN
  Administered 2022-04-02: 3 mL

## 2022-04-02 MED ORDER — GABAPENTIN 300 MG PO CAPS: 300.0000 mg | ORAL_CAPSULE | Freq: Once | ORAL | Status: AC

## 2022-04-02 MED ORDER — NEOMYCIN-POLYMYXIN B GU 40-200000 IR SOLN
Status: DC | PRN
  Administered 2022-04-02: 14 mL

## 2022-04-02 MED ORDER — CHLORHEXIDINE GLUCONATE 4 % EX LIQD
60.0000 mL | Freq: Once | CUTANEOUS | Status: AC
  Administered 2022-04-02: 4 via TOPICAL

## 2022-04-02 MED ORDER — ACETAMINOPHEN 10 MG/ML IV SOLN
INTRAVENOUS | Status: AC
  Filled 2022-04-02: qty 100

## 2022-04-02 MED ORDER — CEFAZOLIN SODIUM-DEXTROSE 2-4 GM/100ML-% IV SOLN
2.0000 g | Freq: Four times a day (QID) | INTRAVENOUS | Status: AC
  Administered 2022-04-03: 2 g via INTRAVENOUS
  Filled 2022-04-02 (×2): qty 100

## 2022-04-02 MED ORDER — ACETAMINOPHEN 10 MG/ML IV SOLN
1000.0000 mg | Freq: Four times a day (QID) | INTRAVENOUS | Status: DC
  Administered 2022-04-03 (×3): 1000 mg via INTRAVENOUS
  Filled 2022-04-02 (×4): qty 100

## 2022-04-02 MED ORDER — CELECOXIB 200 MG PO CAPS
200.0000 mg | ORAL_CAPSULE | Freq: Two times a day (BID) | ORAL | Status: DC
  Administered 2022-04-02 – 2022-04-03 (×2): 200 mg via ORAL
  Filled 2022-04-02 (×2): qty 1

## 2022-04-02 MED ORDER — CELECOXIB 200 MG PO CAPS
ORAL_CAPSULE | ORAL | Status: AC
Start: 2022-04-02 — End: 2022-04-02
  Administered 2022-04-02: 400 mg via ORAL
  Filled 2022-04-02: qty 2

## 2022-04-02 MED ORDER — HYDROMORPHONE HCL 2 MG PO TABS: 2.0000 mg | ORAL_TABLET | ORAL | Status: DC | PRN

## 2022-04-02 MED ORDER — ONDANSETRON HCL 4 MG/2ML IJ SOLN: 4.0000 mg | Freq: Four times a day (QID) | INTRAMUSCULAR | Status: DC | PRN

## 2022-04-02 MED ORDER — FENTANYL CITRATE (PF) 100 MCG/2ML IJ SOLN
INTRAMUSCULAR | Status: AC
  Administered 2022-04-02: 25 ug via INTRAVENOUS
  Filled 2022-04-02: qty 2

## 2022-04-02 MED ORDER — GABAPENTIN 300 MG PO CAPS
ORAL_CAPSULE | ORAL | Status: AC
Start: 2022-04-02 — End: 2022-04-02
  Administered 2022-04-02: 300 mg via ORAL
  Filled 2022-04-02: qty 1

## 2022-04-02 MED ORDER — FENTANYL CITRATE (PF) 100 MCG/2ML IJ SOLN
25.0000 ug | INTRAMUSCULAR | Status: DC | PRN
  Administered 2022-04-02: 25 ug via INTRAVENOUS

## 2022-04-02 MED ORDER — LORATADINE 10 MG PO TABS
10.0000 mg | ORAL_TABLET | Freq: Every day | ORAL | Status: DC
  Administered 2022-04-02 – 2022-04-03 (×2): 10 mg via ORAL
  Filled 2022-04-02 (×2): qty 1

## 2022-04-02 MED ORDER — DEXAMETHASONE SODIUM PHOSPHATE 10 MG/ML IJ SOLN: 8.0000 mg | Freq: Once | INTRAMUSCULAR | Status: AC

## 2022-04-02 MED ORDER — ORAL CARE MOUTH RINSE: 15.0000 mL | Freq: Once | OROMUCOSAL | Status: AC

## 2022-04-02 MED ORDER — SODIUM CHLORIDE 0.9 % IR SOLN
Status: DC | PRN
  Administered 2022-04-02: 3000 mL

## 2022-04-02 MED ORDER — OXYCODONE HCL 5 MG PO TABS: 10.0000 mg | ORAL_TABLET | ORAL | Status: DC | PRN

## 2022-04-02 MED ORDER — MENTHOL 3 MG MT LOZG: 1.0000 | LOZENGE | OROMUCOSAL | Status: DC | PRN

## 2022-04-02 MED ORDER — ENOXAPARIN SODIUM 30 MG/0.3ML IJ SOSY
30.0000 mg | PREFILLED_SYRINGE | Freq: Two times a day (BID) | INTRAMUSCULAR | Status: DC
  Administered 2022-04-03: 30 mg via SUBCUTANEOUS
  Filled 2022-04-02: qty 0.3

## 2022-04-02 MED ORDER — TRANEXAMIC ACID-NACL 1000-0.7 MG/100ML-% IV SOLN
1000.0000 mg | INTRAVENOUS | Status: AC
  Administered 2022-04-02: 1000 mg via INTRAVENOUS

## 2022-04-02 MED ORDER — FLUTICASONE PROPIONATE 50 MCG/ACT NA SUSP
2.0000 | Freq: Every day | NASAL | Status: DC
  Administered 2022-04-03: 2 via NASAL
  Filled 2022-04-02: qty 16

## 2022-04-02 MED ORDER — PROPOFOL 10 MG/ML IV BOLUS
INTRAVENOUS | Status: AC
  Filled 2022-04-02: qty 20

## 2022-04-02 MED ORDER — CEFAZOLIN SODIUM-DEXTROSE 2-4 GM/100ML-% IV SOLN
INTRAVENOUS | Status: AC
  Administered 2022-04-02: 2 g via INTRAVENOUS
  Filled 2022-04-02: qty 100

## 2022-04-02 MED ORDER — SENNOSIDES-DOCUSATE SODIUM 8.6-50 MG PO TABS
1.0000 | ORAL_TABLET | Freq: Two times a day (BID) | ORAL | Status: DC
Start: 2022-04-02 — End: 2022-04-03
  Administered 2022-04-02 – 2022-04-03 (×2): 1 via ORAL
  Filled 2022-04-02 (×2): qty 1

## 2022-04-02 MED ORDER — LISINOPRIL-HYDROCHLOROTHIAZIDE 10-12.5 MG PO TABS: 1.0000 | ORAL_TABLET | Freq: Every day | ORAL | Status: DC

## 2022-04-02 MED ORDER — HYDROMORPHONE HCL 2 MG PO TABS: 1.0000 mg | ORAL_TABLET | ORAL | Status: DC | PRN

## 2022-04-02 MED ORDER — TRANEXAMIC ACID-NACL 1000-0.7 MG/100ML-% IV SOLN: 1000.0000 mg | Freq: Once | INTRAVENOUS | Status: AC

## 2022-04-02 MED ORDER — DIPHENHYDRAMINE HCL 12.5 MG/5ML PO ELIX: 12.5000 mg | ORAL_SOLUTION | ORAL | Status: DC | PRN

## 2022-04-02 MED ORDER — LISINOPRIL 10 MG PO TABS
10.0000 mg | ORAL_TABLET | Freq: Every day | ORAL | Status: DC
  Filled 2022-04-02: qty 1

## 2022-04-02 MED ORDER — SODIUM CHLORIDE 0.9 % IV SOLN
INTRAVENOUS | Status: DC
Start: 2022-04-02 — End: 2022-04-02

## 2022-04-02 MED ORDER — INSULIN ASPART 100 UNIT/ML IJ SOLN
0.0000 [IU] | Freq: Three times a day (TID) | INTRAMUSCULAR | Status: DC
  Administered 2022-04-03: 5 [IU] via SUBCUTANEOUS
  Administered 2022-04-03: 3 [IU] via SUBCUTANEOUS
  Filled 2022-04-02 (×2): qty 1

## 2022-04-02 MED ORDER — SURGIRINSE WOUND IRRIGATION SYSTEM - OPTIME
TOPICAL | Status: DC | PRN
  Administered 2022-04-02: 450 mL via TOPICAL

## 2022-04-02 MED ORDER — METFORMIN HCL 500 MG PO TABS
250.0000 mg | ORAL_TABLET | Freq: Two times a day (BID) | ORAL | Status: DC
  Administered 2022-04-02 – 2022-04-03 (×2): 250 mg via ORAL
  Filled 2022-04-02 (×2): qty 1

## 2022-04-02 MED ORDER — CELECOXIB 200 MG PO CAPS: 400.0000 mg | ORAL_CAPSULE | Freq: Once | ORAL | Status: AC

## 2022-04-02 MED ORDER — ACETAMINOPHEN 10 MG/ML IV SOLN
INTRAVENOUS | Status: DC | PRN
  Administered 2022-04-02: 1000 mg via INTRAVENOUS

## 2022-04-02 MED ORDER — HYDROCHLOROTHIAZIDE 12.5 MG PO TABS
12.5000 mg | ORAL_TABLET | Freq: Every day | ORAL | Status: DC
  Administered 2022-04-03: 12.5 mg via ORAL
  Filled 2022-04-02: qty 1

## 2022-04-02 MED ORDER — MAGNESIUM HYDROXIDE 400 MG/5ML PO SUSP
30.0000 mL | Freq: Every day | ORAL | Status: DC
  Administered 2022-04-02 – 2022-04-03 (×2): 30 mL via ORAL
  Filled 2022-04-02 (×2): qty 30

## 2022-04-02 MED ORDER — PROPOFOL 500 MG/50ML IV EMUL
INTRAVENOUS | Status: DC | PRN
  Administered 2022-04-02: 200 ug/kg/min via INTRAVENOUS

## 2022-04-02 MED ORDER — PHENYLEPHRINE HCL-NACL 20-0.9 MG/250ML-% IV SOLN
INTRAVENOUS | Status: DC | PRN
  Administered 2022-04-02: 70 ug/min via INTRAVENOUS

## 2022-04-02 MED ORDER — TRANEXAMIC ACID-NACL 1000-0.7 MG/100ML-% IV SOLN
INTRAVENOUS | Status: AC
  Filled 2022-04-02: qty 100

## 2022-04-02 MED ORDER — SODIUM CHLORIDE 0.9 % IV SOLN: INTRAVENOUS | Status: DC

## 2022-04-02 MED ORDER — INSULIN ASPART 100 UNIT/ML IJ SOLN
0.0000 [IU] | Freq: Every day | INTRAMUSCULAR | Status: DC
  Administered 2022-04-02: 3 [IU] via SUBCUTANEOUS
  Filled 2022-04-02: qty 1

## 2022-04-02 MED ORDER — TRAMADOL HCL 50 MG PO TABS: 50.0000 mg | ORAL_TABLET | ORAL | Status: DC | PRN

## 2022-04-02 MED ORDER — PANTOPRAZOLE SODIUM 40 MG PO TBEC
40.0000 mg | DELAYED_RELEASE_TABLET | Freq: Two times a day (BID) | ORAL | Status: DC
  Administered 2022-04-02 – 2022-04-03 (×2): 40 mg via ORAL
  Filled 2022-04-02 (×2): qty 1

## 2022-04-02 MED ORDER — HYDROMORPHONE HCL 1 MG/ML IJ SOLN: 0.5000 mg | INTRAMUSCULAR | Status: DC | PRN

## 2022-04-02 MED ORDER — VITAMIN B-12 1000 MCG PO TABS
1000.0000 ug | ORAL_TABLET | ORAL | Status: DC
  Filled 2022-04-02: qty 1

## 2022-04-02 MED ORDER — ALUM & MAG HYDROXIDE-SIMETH 200-200-20 MG/5ML PO SUSP: 30.0000 mL | ORAL | Status: DC | PRN

## 2022-04-02 SURGICAL SUPPLY — 63 items
BLADE DRUM FLTD (BLADE) ×2 IMPLANT
BLADE SAW 90X25X1.19 OSCILLAT (BLADE) ×2 IMPLANT
CARTRIDGE OIL MAESTRO DRILL (MISCELLANEOUS) ×1 IMPLANT
DIFFUSER DRILL AIR PNEUMATIC (MISCELLANEOUS) ×2 IMPLANT
DRAPE 3/4 80X56 (DRAPES) ×2 IMPLANT
DRAPE INCISE IOBAN 66X60 STRL (DRAPES) ×2 IMPLANT
DRSG DERMACEA 8X12 NADH (GAUZE/BANDAGES/DRESSINGS) ×2 IMPLANT
DRSG MEPILEX SACRM 8.7X9.8 (GAUZE/BANDAGES/DRESSINGS) ×2 IMPLANT
DRSG OPSITE POSTOP 4X14 (GAUZE/BANDAGES/DRESSINGS) ×2 IMPLANT
DRSG TEGADERM 4X4.75 (GAUZE/BANDAGES/DRESSINGS) ×2 IMPLANT
DURAPREP 26ML APPLICATOR (WOUND CARE) ×2 IMPLANT
ELECT CAUTERY BLADE 6.4 (BLADE) ×2 IMPLANT
ELECT REM PT RETURN 9FT ADLT (ELECTROSURGICAL) ×2
ELECTRODE REM PT RTRN 9FT ADLT (ELECTROSURGICAL) ×1 IMPLANT
GAUZE 4X4 16PLY ~~LOC~~+RFID DBL (SPONGE) ×2 IMPLANT
GLOVE SURG ENC MOIS LTX SZ7.5 (GLOVE) ×4 IMPLANT
GLOVE SURG ENC TEXT LTX SZ7.5 (GLOVE) ×4 IMPLANT
GLOVE SURG UNDER LTX SZ8 (GLOVE) ×2 IMPLANT
GLOVE SURG UNDER POLY LF SZ7.5 (GLOVE) ×2 IMPLANT
GOWN STRL REUS W/ TWL LRG LVL3 (GOWN DISPOSABLE) ×2 IMPLANT
GOWN STRL REUS W/ TWL XL LVL3 (GOWN DISPOSABLE) ×1 IMPLANT
GOWN STRL REUS W/TWL LRG LVL3 (GOWN DISPOSABLE) ×2
GOWN STRL REUS W/TWL XL LVL3 (GOWN DISPOSABLE) ×1
HEAD FEM STD 32X+5 STRL (Hips) ×1 IMPLANT
HEMOVAC 400CC 10FR (MISCELLANEOUS) ×2 IMPLANT
HOLDER FOLEY CATH W/STRAP (MISCELLANEOUS) ×2 IMPLANT
HOLSTER ELECTROSUGICAL PENCIL (MISCELLANEOUS) ×4 IMPLANT
HOOD PEEL AWAY FLYTE STAYCOOL (MISCELLANEOUS) ×4 IMPLANT
IV NS IRRIG 3000ML ARTHROMATIC (IV SOLUTION) ×2 IMPLANT
KIT PEG BOARD PINK (KITS) ×2 IMPLANT
KIT TURNOVER KIT A (KITS) ×2 IMPLANT
LINER MARATHON 10 DEG 32MMX450 (Hips) ×1 IMPLANT
LINER MARATHON 10D 32MMX450 (Hips) IMPLANT
MANIFOLD NEPTUNE II (INSTRUMENTS) ×4 IMPLANT
NDL SAFETY ECLIPSE 18X1.5 (NEEDLE) ×1 IMPLANT
NEEDLE HYPO 18GX1.5 SHARP (NEEDLE) ×1
NS IRRIG 500ML POUR BTL (IV SOLUTION) ×2 IMPLANT
OIL CARTRIDGE MAESTRO DRILL (MISCELLANEOUS) ×2
PACK HIP PROSTHESIS (MISCELLANEOUS) ×2 IMPLANT
PENCIL SMOKE EVACUATOR COATED (MISCELLANEOUS) ×2 IMPLANT
PIN SECT CUP 50MM (Hips) ×1 IMPLANT
PIN STEIN THRED 5/32 (Pin) ×2 IMPLANT
PULSAVAC PLUS IRRIG FAN TIP (DISPOSABLE) ×2
SOL PREP PVP 2OZ (MISCELLANEOUS) ×2
SOLUTION IRRIG SURGIPHOR (IV SOLUTION) ×2 IMPLANT
SOLUTION PREP PVP 2OZ (MISCELLANEOUS) ×1 IMPLANT
SPONGE DRAIN TRACH 4X4 STRL 2S (GAUZE/BANDAGES/DRESSINGS) ×2 IMPLANT
SPONGE T-LAP 18X18 ~~LOC~~+RFID (SPONGE) ×8 IMPLANT
STAPLER SKIN PROX 35W (STAPLE) ×2 IMPLANT
STEM AML 12X155X30 STD SM 6IN (Joint) ×1 IMPLANT
SUT ETHIBOND #5 BRAIDED 30INL (SUTURE) ×2 IMPLANT
SUT VIC AB 0 CT1 36 (SUTURE) ×2 IMPLANT
SUT VIC AB 1 CT1 36 (SUTURE) ×4 IMPLANT
SUT VIC AB 2-0 CT1 27 (SUTURE) ×1
SUT VIC AB 2-0 CT1 TAPERPNT 27 (SUTURE) ×1 IMPLANT
SYR 20ML LL LF (SYRINGE) ×2 IMPLANT
TAPE CLOTH 3X10 WHT NS LF (GAUZE/BANDAGES/DRESSINGS) ×2 IMPLANT
TAPE TRANSPORE STRL 2 31045 (GAUZE/BANDAGES/DRESSINGS) ×2 IMPLANT
TIP FAN IRRIG PULSAVAC PLUS (DISPOSABLE) ×1 IMPLANT
TOWEL OR 17X26 4PK STRL BLUE (TOWEL DISPOSABLE) ×2 IMPLANT
TRAY FOLEY MTR SLVR 16FR STAT (SET/KITS/TRAYS/PACK) ×2 IMPLANT
TUBE KAMVAC SUCTION (TUBING) ×2 IMPLANT
WATER STERILE IRR 1000ML POUR (IV SOLUTION) ×2 IMPLANT

## 2022-04-02 NOTE — Anesthesia Procedure Notes (Signed)
Spinal ? ?Patient location during procedure: OR ?Start time: 04/02/2022 11:31 AM ?End time: 04/02/2022 11:34 AM ?Reason for block: surgical anesthesia ?Staffing ?Performed: resident/CRNA  ?Anesthesiologist: Piscitello, Precious Haws, MD ?Resident/CRNA: Nelda Marseille, CRNA ?Preanesthetic Checklist ?Completed: patient identified, IV checked, site marked, risks and benefits discussed, surgical consent, monitors and equipment checked, pre-op evaluation and timeout performed ?Spinal Block ?Patient position: sitting ?Prep: ChloraPrep ?Patient monitoring: heart rate, continuous pulse ox, blood pressure and cardiac monitor ?Approach: midline ?Location: L3-4 ?Injection technique: single-shot ?Needle ?Needle type: Whitacre and Introducer  ?Needle gauge: 25 G ?Needle length: 9 cm ?Assessment ?Sensory level: T10 ?Events: CSF return ?Additional Notes ?Sterile aseptic technique used throughout the procedure.  Negative paresthesia. Negative blood return. Positive free-flowing CSF. Expiration date of kit checked and confirmed. Patient tolerated procedure well, without complications. ? ? ? ? ? ?

## 2022-04-02 NOTE — Progress Notes (Signed)
PT Cancellation Note ? ?Patient Details ?Name: Clayton Parker ?MRN: [id number] ?DOB: 04-Feb-1941 ? ? ?Cancelled Treatment:    Reason Eval/Treat Not Completed: Patient not medically ready.  PT consult received.  Chart reviewed.  Pt s/p R THA with spinal anesthesia.  Nurse reports pt unable to move his feet yet (pt does not have adequate LE strength to participate in therapy); pt also getting fluid bolus d/t low BP.  Will hold PT at this time and re-attempt PT evaluation tomorrow. ? ?Leitha Bleak, PT ?04/02/22, 4:41 PM ? ?

## 2022-04-02 NOTE — Transfer of Care (Signed)
Immediate Anesthesia Transfer of Care Note ? ?Patient: Clayton Parker ? ?Procedure(s) Performed: TOTAL HIP ARTHROPLASTY (Right: Hip) ? ?Patient Location: PACU ? ?Anesthesia Type:Spinal ? ?Level of Consciousness: awake, alert  and oriented ? ?Airway & Oxygen Therapy: Patient Spontanous Breathing and Patient connected to face mask oxygen ? ?Post-op Assessment: Report given to RN and Post -op Vital signs reviewed and stable ? ?Post vital signs: Reviewed and stable ? ?Last Vitals:  ?Vitals Value Taken Time  ?BP 90/49 04/02/22 1515  ?Temp    ?Pulse 103 04/02/22 1523  ?Resp 19 04/02/22 1523  ?SpO2 99 % 04/02/22 1523  ?Vitals shown include unvalidated device data. ? ?Last Pain:  ?Vitals:  ? 04/02/22 0958  ?TempSrc: Temporal  ?PainSc: 4   ?   ? ?  ? ?Complications: No notable events documented. ?

## 2022-04-02 NOTE — Anesthesia Procedure Notes (Signed)
Date/Time: 04/02/2022 11:52 AM ?Performed by: Nelda Marseille, CRNA ?Pre-anesthesia Checklist: Patient identified, Emergency Drugs available, Suction available, Patient being monitored and Timeout performed ?Oxygen Delivery Method: Simple face mask ? ? ? ? ?

## 2022-04-02 NOTE — Anesthesia Preprocedure Evaluation (Signed)
Anesthesia Evaluation  ?Patient identified by MRN, date of birth, ID band ?Patient awake ? ? ? ?Reviewed: ?Allergy & Precautions, H&P , NPO status , Patient's Chart, lab work & pertinent test results, reviewed documented beta blocker date and time  ? ?Airway ?Mallampati: II ? ? ?Neck ROM: full ? ? ? Dental ? ?(+) Poor Dentition ?  ?Pulmonary ?neg pulmonary ROS,  ?  ?Pulmonary exam normal ? ? ? ? ? ? ? Cardiovascular ?Exercise Tolerance: Good ?hypertension, On Medications ?+ CAD  ?Normal cardiovascular exam ?Rhythm:regular Rate:Normal ? ? ?  ?Neuro/Psych ?negative neurological ROS ? negative psych ROS  ? GI/Hepatic ?Neg liver ROS, GERD  Medicated,  ?Endo/Other  ?negative endocrine ROSdiabetes, Well Controlled, Type 2, Oral Hypoglycemic Agents ? Renal/GU ?Renal disease  ?negative genitourinary ?  ?Musculoskeletal ? ? Abdominal ?  ?Peds ? Hematology ? ?(+) Blood dyscrasia, anemia ,   ?Anesthesia Other Findings ?Past Medical History: ?No date: Arthritis ?No date: Chronic kidney disease ?No date: Coronary artery disease ?    Comment:  stent x 1 ?No date: Diabetes mellitus without complication (HCC) ?No date: Family history of adverse reaction to anesthesia ?    Comment:  Daughter - PONV ?No date: GERD (gastroesophageal reflux disease) ?No date: Hyperlipidemia ?No date: Hypertension ?Past Surgical History: ?No date: cardaic stent ?    Comment:  2004 ?12/03/2017: CATARACT EXTRACTION W/PHACO; Left ?    Comment:  Procedure: CATARACT EXTRACTION PHACO AND INTRAOCULAR  ?             LENS PLACEMENT (IOC) LEFT DIABETIC;  Surgeon: Brooke Dare,  ?             Earl Gala, MD;  Location: [facility];   ?             Service: Ophthalmology;  Laterality: Left;  Diabetic -  ?             oral meds ?02/18/2018: CATARACT EXTRACTION W/PHACO; Right ?    Comment:  Procedure: CATARACT EXTRACTION PHACO AND INTRAOCULAR  ?             LENS PLACEMENT (IOC) RIGHT DIABETIC;  Surgeon: Brooke Dare,  ?             Earl Gala, MD;  Location: [facility];   ?             Service: Ophthalmology;  Laterality: Right;  Diabetic -  ?             oral meds ?No date: COLONOSCOPY ?No date: CORONARY ANGIOPLASTY ?No date: HERNIA REPAIR ?BMI   ? Body Mass Index: 24.47 kg/m?  ?  ? Reproductive/Obstetrics ?negative OB ROS ? ?  ? ? ? ? ? ? ? ? ? ? ? ? ? ?  ?  ? ? ? ? ? ? ? ? ?Anesthesia Physical ?Anesthesia Plan ? ?ASA: 3 ? ?Anesthesia Plan: Spinal  ? ?Post-op Pain Management:   ? ?Induction:  ? ?PONV Risk Score and Plan: 2 ? ?Airway Management Planned:  ? ?Additional Equipment:  ? ?Intra-op Plan:  ? ?Post-operative Plan:  ? ?Informed Consent: I have reviewed the patients History and Physical, chart, labs and discussed the procedure including the risks, benefits and alternatives for the proposed anesthesia with the patient or authorized representative who has indicated his/her understanding and acceptance.  ? ? ? ?Dental Advisory Given ? ?Plan Discussed with: CRNA ? ?Anesthesia Plan Comments:   ? ? ? ? ? ? ?Anesthesia Quick  Evaluation ? ?

## 2022-04-02 NOTE — Op Note (Signed)
OPERATIVE NOTE ? ?DATE OF SURGERY:  04/02/2022 ? ?PATIENT NAME:  Clayton Parker   ?DOB: [birth year]  ?MRN: [id number] ? ?PRE-OPERATIVE DIAGNOSIS: Degenerative arthrosis of the right hip, primary ? ?POST-OPERATIVE DIAGNOSIS:  Same ? ?PROCEDURE:  Right total hip arthroplasty ? ?SURGEON:  Jena Gauss. M.D. ? ?ASSISTANT: Baldwin Jamaica, PA-C (present and scrubbed throughout the case, critical for assistance with exposure, retraction, instrumentation, and closure) ? ?ANESTHESIA: spinal ? ?ESTIMATED BLOOD LOSS: 250 mL ? ?FLUIDS REPLACED: 1200 mL of crystalloid ? ?DRAINS: 2 medium Hemovac drains ? ?IMPLANTS UTILIZED: DePuy 12 mm small stature AML femoral stem, 50 mm OD Pinnacle 100 acetabular component, +4 mm 10 degree Pinnacle Marathon polyethylene insert, and a 32 mm CoCr +5 mm hip ball ? ?INDICATIONS FOR SURGERY: Clayton Parker is a 81 y.o. year old male with a long history of progressive hip and groin  pain. X-rays demonstrated severe degenerative changes. The patient had not seen any significant improvement despite conservative nonsurgical intervention. After discussion of the risks and benefits of surgical intervention, the patient expressed understanding of the risks benefits and agree with plans for total hip arthroplasty.  ? ?The risks, benefits, and alternatives were discussed at length including but not limited to the risks of infection, bleeding, nerve injury, stiffness, blood clots, the need for revision surgery, limb length inequality, dislocation, cardiopulmonary complications, among others, and they were willing to proceed. ? ?PROCEDURE IN DETAIL: The patient was brought into the operating room and, after adequate spinal anesthesia was achieved, the patient was placed in a left lateral decubitus position. Axillary roll was placed and all bony prominences were well-padded. The patient's right hip was cleaned and prepped with alcohol and DuraPrep and draped in the usual sterile fashion. A "timeout" was  performed as per usual protocol. A lateral curvilinear incision was made gently curving towards the posterior superior iliac spine. The IT band was incised in line with the skin incision and the fibers of the gluteus maximus were split in line. The piriformis tendon was identified, skeletonized, and incised at its insertion to the proximal femur and reflected posteriorly. A T type posterior capsulotomy was performed. Prior to dislocation of the femoral head, a threaded Steinmann pin was inserted through a separate stab incision into the pelvis superior to the acetabulum and bent in the form of a stylus so as to assess limb length and hip offset throughout the procedure. The femoral head was then dislocated posteriorly. Inspection of the femoral head demonstrated severe degenerative changes with full-thickness loss of articular cartilage. The femoral neck cut was performed using an oscillating saw. The anterior capsule was elevated off of the femoral neck using a periosteal elevator. Attention was then directed to the acetabulum. The remnant of the labrum was excised using electrocautery. Inspection of the acetabulum also demonstrated significant degenerative changes. The acetabulum was reamed in sequential fashion up to a 49 mm diameter. Good punctate bleeding bone was encountered. A 50 mm Pinnacle 100 acetabular component was positioned and impacted into place. Good scratch fit was appreciated. A +4 mm neutral polyethylene trial was inserted. ? ?Attention was then directed to the proximal femur. A hole for reaming of the proximal femoral canal was created using a high-speed burr. The femoral canal was reamed in sequential fashion up to a 11.5 mm diameter. This allowed for approximately 6 cm of scratch fit. Serial broaches were inserted up to a 12 mm small stature femoral broach. Calcar region was planed and a trial reduction was  performed using a 32 mm hip ball with a +5 mm neck length.  Reasonably good stability  was noted but it was elected to trial with a +4 mm 10 degree liner with the high side positioned at the 8 o'clock position.  Good equalization of limb lengths and hip offset was appreciated and excellent stability was noted both anteriorly and posteriorly. Trial components were removed. The acetabular shell was irrigated with copious amounts of normal saline with antibiotic solution and suctioned dry. A +4 mm 10 degree Pinnacle Marathon polyethylene insert was positioned with the high side positioned at the 8 o'clock position and impacted into place. Next, a 12 mm small stature AML femoral stem was positioned and impacted into place. Excellent scratch fit was appreciated. A trial reduction was again performed with a 32 mm hip ball with a +5 mm neck length. Again, good equalization of limb lengths was appreciated and excellent stability appreciated both anteriorly and posteriorly. The hip was then dislocated and the trial hip ball was removed. The Morse taper was cleaned and dried. A 32 mm cobalt chrome hip ball with a +5 mm neck length was placed on the trunnion and impacted into place. The hip was then reduced and placed through range of motion. Excellent stability was appreciated both anteriorly and posteriorly. ? ?The wound was irrigated with copious amounts of normal saline followed by 350 ml of Prontosan and suctioned dry. Good hemostasis was appreciated. The posterior capsulotomy was repaired using #5 Ethibond. Piriformis tendon was reapproximated to the undersurface of the gluteus medius tendon using #5 Ethibond. The IT band was reapproximated using interrupted sutures of #1 Vicryl. Subcutaneous tissue was approximated using first #0 Vicryl followed by #2-0 Vicryl. The skin was closed with skin staples. ? ?The patient tolerated the procedure well and was transported to the recovery room in stable condition.  ? ?Jena Gauss., M.D.  ?

## 2022-04-02 NOTE — H&P (Signed)
The patient has been re-examined, and the chart reviewed, and there have been no interval changes to the documented history and physical.    The risks, benefits, and alternatives have been discussed at length. The patient expressed understanding of the risks benefits and agreed with plans for surgical intervention.  Adilyn Humes P. Zayonna Ayuso, Jr. M.D.    

## 2022-04-03 ENCOUNTER — Encounter: Payer: Self-pay | Admitting: Orthopedic Surgery

## 2022-04-03 DIAGNOSIS — Z85828 Personal history of other malignant neoplasm of skin: Secondary | ICD-10-CM | POA: Diagnosis not present

## 2022-04-03 DIAGNOSIS — M1611 Unilateral primary osteoarthritis, right hip: Secondary | ICD-10-CM | POA: Diagnosis present

## 2022-04-03 DIAGNOSIS — Z808 Family history of malignant neoplasm of other organs or systems: Secondary | ICD-10-CM | POA: Diagnosis not present

## 2022-04-03 DIAGNOSIS — M25559 Pain in unspecified hip: Secondary | ICD-10-CM | POA: Diagnosis present

## 2022-04-03 DIAGNOSIS — K219 Gastro-esophageal reflux disease without esophagitis: Secondary | ICD-10-CM | POA: Diagnosis present

## 2022-04-03 DIAGNOSIS — E7800 Pure hypercholesterolemia, unspecified: Secondary | ICD-10-CM | POA: Diagnosis present

## 2022-04-03 DIAGNOSIS — M159 Polyosteoarthritis, unspecified: Secondary | ICD-10-CM | POA: Diagnosis present

## 2022-04-03 DIAGNOSIS — E1122 Type 2 diabetes mellitus with diabetic chronic kidney disease: Secondary | ICD-10-CM | POA: Diagnosis present

## 2022-04-03 DIAGNOSIS — I129 Hypertensive chronic kidney disease with stage 1 through stage 4 chronic kidney disease, or unspecified chronic kidney disease: Secondary | ICD-10-CM | POA: Diagnosis present

## 2022-04-03 DIAGNOSIS — Z7982 Long term (current) use of aspirin: Secondary | ICD-10-CM | POA: Diagnosis not present

## 2022-04-03 DIAGNOSIS — G4730 Sleep apnea, unspecified: Secondary | ICD-10-CM | POA: Diagnosis present

## 2022-04-03 DIAGNOSIS — N529 Male erectile dysfunction, unspecified: Secondary | ICD-10-CM | POA: Diagnosis present

## 2022-04-03 DIAGNOSIS — Z79899 Other long term (current) drug therapy: Secondary | ICD-10-CM | POA: Diagnosis not present

## 2022-04-03 DIAGNOSIS — I2510 Atherosclerotic heart disease of native coronary artery without angina pectoris: Secondary | ICD-10-CM | POA: Diagnosis present

## 2022-04-03 DIAGNOSIS — N1831 Chronic kidney disease, stage 3a: Secondary | ICD-10-CM | POA: Diagnosis present

## 2022-04-03 DIAGNOSIS — Z7984 Long term (current) use of oral hypoglycemic drugs: Secondary | ICD-10-CM | POA: Diagnosis not present

## 2022-04-03 DIAGNOSIS — E559 Vitamin D deficiency, unspecified: Secondary | ICD-10-CM | POA: Diagnosis present

## 2022-04-03 LAB — GLUCOSE, CAPILLARY
Glucose-Capillary: 166 mg/dL — ABNORMAL HIGH (ref 70–99)
Glucose-Capillary: 216 mg/dL — ABNORMAL HIGH (ref 70–99)

## 2022-04-03 MED ORDER — TRAMADOL HCL 50 MG PO TABS: 50.0000 mg | ORAL_TABLET | ORAL | 0 refills | Status: DC | PRN

## 2022-04-03 MED ORDER — ENOXAPARIN SODIUM 40 MG/0.4ML IJ SOSY: 40.0000 mg | PREFILLED_SYRINGE | INTRAMUSCULAR | 0 refills | Status: DC

## 2022-04-03 MED ORDER — CELECOXIB 200 MG PO CAPS: 200.0000 mg | ORAL_CAPSULE | Freq: Two times a day (BID) | ORAL | 0 refills | Status: DC

## 2022-04-03 MED ORDER — OXYCODONE HCL 5 MG PO TABS: 5.0000 mg | ORAL_TABLET | ORAL | 0 refills | Status: DC | PRN

## 2022-04-03 NOTE — Progress Notes (Signed)
?  Subjective: ?1 Day Post-Op Procedure(s) (LRB): ?TOTAL HIP ARTHROPLASTY (Right) ?Patient reports pain as mild.   ?Patient is well, and has had no acute complaints or problems ?Plan is to go Home after hospital stay. ?Negative for chest pain and shortness of breath ?Fever: no ?Gastrointestinal: negative for nausea and vomiting.  Patient has not had a bowel movement. ? ?Objective: ?Vital signs in last 24 hours: ?Temp:  [97.1 ?F (36.2 ?C)-98.2 ?F (36.8 ?C)] 98.2 ?F (36.8 ?C) (04/11 3559) ?Pulse Rate:  [84-107] 88 (04/11 0748) ?Resp:  [0-20] 16 (04/11 0748) ?BP: (90-151)/(46-98) 93/51 (04/11 0748) ?SpO2:  [96 %-100 %] 97 % (04/11 0748) ?Weight:  [65.7 kg] 65.7 kg (04/10 0958) ? ?Intake/Output from previous day: ? ?Intake/Output Summary (Last 24 hours) at 04/03/2022 0811 ?Last data filed at 04/03/2022 0400 ?Gross per 24 hour  ?Intake 3078.19 ml  ?Output 690 ml  ?Net 2388.19 ml  ?  ?Intake/Output this shift: ?No intake/output data recorded. ? ?Labs: ?No results for input(s): HGB in the last 72 hours. ?No results for input(s): WBC, RBC, HCT, PLT in the last 72 hours. ?No results for input(s): NA, K, CL, CO2, BUN, CREATININE, GLUCOSE, CALCIUM in the last 72 hours. ?No results for input(s): LABPT, INR in the last 72 hours. ? ? ?EXAM ?General - Patient is Alert, Appropriate, and Oriented ?Extremity - Neurovascular intact ?Dorsiflexion/Plantar flexion intact ?Compartment soft ?Dressing/Incision -clean, dry, no drainage, Hemovac in place.  ?Motor Function - intact, moving foot and toes well on exam.  ?Cardiovascular- Regular rate and rhythm, no murmurs/rubs/gallops ?Respiratory- Lungs clear to auscultation bilaterally ?Gastrointestinal- soft, nontender, and active bowel sounds ? ? ?Assessment/Plan: ?1 Day Post-Op Procedure(s) (LRB): ?TOTAL HIP ARTHROPLASTY (Right) ?Principal Problem: ?  Hx of total hip arthroplasty, right ? ?Estimated body mass index is 24.47 kg/m? as calculated from the following: ?  Height as of this  encounter: 5' 4.5" (1.638 m). ?  Weight as of this encounter: 65.7 kg. ?Advance diet ?Up with therapy ? ?Anticipate d/c later today pending completion of therapy goals.  ? ?Hemovac removed. and Mini compression dressing applied.  ? ?DVT Prophylaxis - Lovenox, Ted hose, and SCDs ?Weight-Bearing as tolerated to right leg ? ?Baldwin Jamaica, PA-C ?[facility] Orthopaedic Surgery ?04/03/2022, 8:11 AM ? ?

## 2022-04-03 NOTE — Progress Notes (Signed)
Met with the patient in the room at the bedside ?They live at home with their spouse ?They currently have DME listed ?They need no additional DME ?They have transportation with family ?They can afford their medication ?They are set up with [facility] services  ?

## 2022-04-03 NOTE — Discharge Summary (Signed)
Physician Discharge Summary  ?Patient ID: ?Clayton Parker ?MRN: [id number] ?DOB/AGE: 1941-01-29 81 y.o. ? ?Admit date: 04/02/2022 ?Discharge date: 04/03/2022 ? ?Admission Diagnoses:  ?Hx of total hip arthroplasty, right [Z96.641] ? ?Surgeries:Procedure(s): ?Degenerative arthrosis of the right hip, primary ?  ?POST-OPERATIVE DIAGNOSIS:  Same ?  ?PROCEDURE:  Right total hip arthroplasty ?  ?SURGEON:  Jena Gauss. M.D. ?  ?ASSISTANT: Baldwin Jamaica, PA-C (present and scrubbed throughout the case, critical for assistance with exposure, retraction, instrumentation, and closure) ?  ?ANESTHESIA: spinal ?  ?ESTIMATED BLOOD LOSS: 250 mL ?  ?FLUIDS REPLACED: 1200 mL of crystalloid ?  ?DRAINS: 2 medium Hemovac drains ?  ?IMPLANTS UTILIZED: DePuy 12 mm small stature AML femoral stem, 50 mm OD Pinnacle 100 acetabular component, +4 mm 10 degree Pinnacle Marathon polyethylene insert, and a 32 mm CoCr +5 mm hip ball ? ?Discharge Diagnoses: ?Patient Active Problem List  ? Diagnosis Date Noted  ? Hx of total hip arthroplasty, right 04/02/2022  ? CAD (coronary artery disease) 04/01/2022  ? ED (erectile dysfunction) 04/01/2022  ? GERD (gastroesophageal reflux disease) 04/01/2022  ? Hx of skin cancer, basal cell 04/01/2022  ? Type 2 diabetes mellitus with chronic kidney disease, without long-term current use of insulin (HCC) 04/01/2022  ? Primary osteoarthritis of right hip 01/07/2022  ? Mild anemia 10/16/2021  ? 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 Family history of skin cancer 05/23/2016  ? Essential hypertension 11/16/2014  ? Pure hypercholesterolemia 11/16/2014  ? ? ?Past Medical History:  ?Diagnosis Date  ? Arthritis   ? Chronic kidney disease   ? Coronary artery disease   ? stent x 1  ? Diabetes mellitus without complication (HCC)   ? Family history of adverse reaction to anesthesia   ? Daughter - PONV  ? GERD (gastroesophageal reflux disease)   ? Hyperlipidemia   ? Hypertension   ? ?  ?Transfusion:  ?  ?Consultants (if any):   ? ?Discharged Condition: Improved ? ?Hospital Course: Clayton Parker is an 81 y.o. male who was admitted 04/02/2022 with a diagnosis of right hip osteoarthritis and went to the operating room on 04/02/2022 and underwent right total hip arthroplasty through posterior approach. The patient received perioperative antibiotics for prophylaxis (see below). The patient tolerated the procedure well and was transported to PACU in stable condition. After meeting PACU criteria, the patient was subsequently transferred to the Orthopaedics/Rehabilitation unit.  ? ?The patient received DVT prophylaxis in the form of early mobilization, Lovenox, TED hose, and SCDs . A sacral pad had been placed and heels were elevated off of the bed with rolled towels in order to protect skin integrity. Foley catheter was discontinued on postoperative day #0. Wound drains were discontinued on postoperative day #1. The surgical incision was healing well without signs of infection. ? ?Physical therapy was initiated postoperatively for transfers, gait training, and strengthening. Occupational therapy was initiated for activities of daily living and evaluation for assisted devices. Rehabilitation goals were reviewed in detail with the patient. The patient made steady progress with physical therapy and physical therapy recommended discharge to Home.  ? ?The patient achieved the preliminary goals of this hospitalization and was felt to be medically and orthopaedically appropriate for discharge. ? ?He was given perioperative antibiotics:  ?Anti-infectives (From admission, onward)  ? ? Start     Dose/Rate Route Frequency Ordered Stop  ? 04/02/22 1800  ceFAZolin (ANCEF) IVPB 2g/100 mL premix       ? 2 g ?200 mL/hr over 30  Minutes Intravenous Every 6 hours 04/02/22 1454 04/03/22 0949  ? 04/02/22 0957  ceFAZolin (ANCEF) 2-4 GM/100ML-% IVPB       ?Note to Pharmacy: Christene SlatesHopkins, Erika W: cabinet override  ?    04/02/22 0957 04/03/22 0935  ? 04/02/22 0600   ceFAZolin (ANCEF) IVPB 2g/100 mL premix       ? 2 g ?200 mL/hr over 30 Minutes Intravenous On call to O.R. 04/02/22 0026 04/02/22 1158  ? ?  ?. ? ?Recent vital signs:  ?Vitals:  ? 04/03/22 0748 04/03/22 1124  ?BP: (!) 93/51 (!) 125/53  ?Pulse: 88 (!) 103  ?Resp: 16 16  ?Temp: 98.2 ?F (36.8 ?C) 98.4 ?F (36.9 ?C)  ?SpO2: 97% 100%  ? ? ?Recent laboratory studies:  ?No results for input(s): WBC, HGB, HCT, PLT, K, CL, CO2, BUN, CREATININE, GLUCOSE, CALCIUM, LABPT, INR in the last 72 hours. ? ?Diagnostic Studies: DG Hip Port Unilat With Pelvis 1V Right ? ?Result Date: 04/02/2022 ?CLINICAL DATA:  Postoperative EXAM: DG HIP (WITH OR WITHOUT PELVIS) 1V PORT RIGHT COMPARISON:  None. FINDINGS: Status post recent total right hip arthroplasty. No perihardware lucency is seen to indicate evidence of hardware loosening. Expected postoperative changes including right hip soft tissue swelling posterolateral surgical skin staples, and a right hip drain. Moderate left femoroacetabular joint space narrowing and peripheral osteophytosis. Numerous vascular phleboliths overlie the pelvis. No acute fracture or dislocation. IMPRESSION: Status post recent total right hip arthroplasty without evidence of hardware failure. Electronically Signed   By: Neita Garnetonald  Viola M.D.   On: 04/02/2022 15:43   ? ?Discharge Medications:   ?Allergies as of 04/03/2022   ?No Known Allergies ?  ? ?  ?Medication List  ?  ? ?STOP taking these medications   ? ?aspirin 81 MG tablet ?  ?ibuprofen 200 MG tablet ?Commonly known as: ADVIL ?  ? ?  ? ?TAKE these medications   ? ?AIRBORNE PO ?Take 1 tablet by mouth daily. ?  ?atorvastatin 80 MG tablet ?Commonly known as: LIPITOR ?Take 80 mg by mouth daily. ?  ?celecoxib 200 MG capsule ?Commonly known as: CELEBREX ?Take 1 capsule (200 mg total) by mouth 2 (two) times daily. ?  ?cetirizine 10 MG tablet ?Commonly known as: ZYRTEC ?Take 10 mg by mouth daily. ?  ?CHEWABLE CALCIUM/D PO ?Take 2 tablets by mouth daily. ?  ?CoQ-10 100  MG Caps ?Take 100 mg by mouth daily. ?  ?enoxaparin 40 MG/0.4ML injection ?Commonly known as: LOVENOX ?Inject 0.4 mLs (40 mg total) into the skin daily for 14 days. ?  ?fluticasone 50 MCG/ACT nasal spray ?Commonly known as: FLONASE ?Place 2 sprays into both nostrils daily. ?  ?lisinopril-hydrochlorothiazide 10-12.5 MG tablet ?Commonly known as: ZESTORETIC ?Take 1 tablet by mouth daily. ?  ?metFORMIN 500 MG tablet ?Commonly known as: GLUCOPHAGE ?Take 250 mg by mouth 2 (two) times daily. ?  ?multivitamin tablet ?Take 2 tablets by mouth daily. ?  ?omeprazole 20 MG tablet ?Commonly known as: PRILOSEC OTC ?Take 20 mg by mouth 3 (three) times a week. ?  ?OSTEO BI-FLEX TRIPLE STRENGTH PO ?Take 2 tablets by mouth daily. ?  ?oxyCODONE 5 MG immediate release tablet ?Commonly known as: Oxy IR/ROXICODONE ?Take 1 tablet (5 mg total) by mouth every 4 (four) hours as needed for severe pain. ?  ?sildenafil 20 MG tablet ?Commonly known as: REVATIO ?Take 20 mg by mouth daily as needed (ED). ?  ?traMADol 50 MG tablet ?Commonly known as: ULTRAM ?Take 1 tablet (50 mg total) by  mouth every 4 (four) hours as needed for moderate pain. ?  ?vitamin B-12 1000 MCG tablet ?Commonly known as: CYANOCOBALAMIN ?Take 1,000 mcg by mouth 3 (three) times a week. ?  ? ?  ? ?  ?  ? ? ?  ?Durable Medical Equipment  ?(From admission, onward)  ?  ? ? ?  ? ?  Start     Ordered  ? 04/02/22 1723  DME Walker rolling  Once       ?Question:  Patient needs a walker to treat with the following condition  Answer:  S/P total hip arthroplasty  ? 04/02/22 1722  ? 04/02/22 1723  DME Bedside commode  Once       ?Question:  Patient needs a bedside commode to treat with the following condition  Answer:  S/P total hip arthroplasty  ? 04/02/22 1722  ? ?  ?  ? ?  ? ? ?Disposition: Home with home health PT ? ? ? ? Follow-up Information   ? ? Donato Heinz, MD Follow up on 05/15/2022.   ?Specialty: Orthopedic Surgery ?Why: at 9:45am ?Contact information: ?[address] ?[facility] ?[place] [address] ?[phone] ? ? ?  ?  ? ?  ?  ? ?  ? ? ? ?Lasandra Beech, PA-C ?04/03/2022, 3:31 PM ? ? ? ? ? ?

## 2022-04-03 NOTE — Anesthesia Postprocedure Evaluation (Signed)
Anesthesia Post Note ? ?Patient: Clayton Parker ? ?Procedure(s) Performed: TOTAL HIP ARTHROPLASTY (Right: Hip) ? ?Patient location during evaluation: Nursing Unit ?Anesthesia Type: Spinal ?Level of consciousness: oriented and awake and alert ?Pain management: pain level controlled ?Vital Signs Assessment: post-procedure vital signs reviewed and stable ?Respiratory status: spontaneous breathing and respiratory function stable ?Cardiovascular status: blood pressure returned to baseline and stable ?Postop Assessment: no headache, no backache, no apparent nausea or vomiting and patient able to bend at knees ?Anesthetic complications: no ? ? ?No notable events documented. ? ? ?Last Vitals:  ?Vitals:  ? 04/03/22 0449 04/03/22 0748  ?BP: (!) 94/46 (!) 93/51  ?Pulse: 98 88  ?Resp: 16 16  ?Temp: 36.8 ?C 36.8 ?C  ?SpO2: 97% 97%  ?  ?Last Pain:  ?Vitals:  ? 04/03/22 0748  ?TempSrc: Oral  ?PainSc:   ? ? ?  ?  ?  ?  ?  ?  ? ?Stormy Fabian A ? ? ? ? ?

## 2022-04-03 NOTE — Progress Notes (Signed)
Physical Therapy Treatment ?Patient Details ?Name: Clayton Parker ?MRN: [id number] ?DOB: 09/07/1941 ?Today's Date: 04/03/2022 ? ? ?History of Present Illness Patient is an 81 year old male s/p right THA (posterior approach) ? ?  ?PT Comments  ? ? Patient tolerated PM session well and was agreeable to treatment. Upon entry into room patient was seated in recliner with family present. At rest patient reported 0/10 pain, however with functional activity pain increased to 6/10. Due to pain and RLE weakness, SLR required Min A to complete 10 reps. Patient was able to ambulate from room>therapy gym steps>back to room with RW at [facility]. Patient was educated on proper stair sequencing, and ascended and descended 4 steps with bilateral hand rails at supervision. Patient was left in recliner with all needs met and family present. Patient would continue to benefit from skilled physical therapy in order to optimize patient's return to PLOF. Continue to recommend HHPT upon discharge from acute hospitalization.  ?   ?Recommendations for follow up therapy are one component of a multi-disciplinary discharge planning process, led by the attending physician.  Recommendations may be updated based on patient status, additional functional criteria and insurance authorization. ? ?Follow Up Recommendations ? Home health PT ?  ?  ?Assistance Recommended at Discharge Intermittent Supervision/Assistance  ?Patient can return home with the following A little help with walking and/or transfers;A little help with bathing/dressing/bathroom;Assist for transportation ?  ?Equipment Recommendations ? None recommended by PT  ?  ?Recommendations for Other Services   ? ? ?  ?Precautions / Restrictions Precautions ?Precautions: Posterior Hip;Fall ?Precaution Booklet Issued: Yes (comment) ?Restrictions ?Weight Bearing Restrictions: Yes ?RLE Weight Bearing: Weight bearing as tolerated  ?  ? ?Mobility ? Bed Mobility ?Overal bed mobility: Needs Assistance ?Bed  Mobility: Supine to Sit, Sit to Supine ?  ?  ?Supine to sit: Min assist (at the trunk) ?Sit to supine: Min assist (assistance with repositioning back in bed, patient hooked LLE under RLE to get back into bed) ?  ?General bed mobility comments: patient started and ended session in recliner ?  ? ?Transfers ?Overall transfer level: Needs assistance ?Equipment used: Rolling walker (2 wheels) ?Transfers: Sit to/from Stand ?Sit to Stand: Supervision ?  ?  ?  ?  ?  ?General transfer comment: cues for hand placement ?  ? ?Ambulation/Gait ?Ambulation/Gait assistance: Supervision ?Gait Distance (Feet): 180 Feet ?Assistive device: Rolling walker (2 wheels) ?Gait Pattern/deviations: Step-through pattern, Decreased step length - right, Decreased step length - left, Decreased stride length, Antalgic, Narrow base of support ?Gait velocity: decreased ?  ?  ?General Gait Details: no LOB noted ? ? ?Stairs ?Stairs: Yes ?Stairs assistance: Supervision ?Stair Management: Step to pattern, Two rails ?Number of Stairs: 4 ?General stair comments: patient educated on proper stair sequencing ? ? ?Wheelchair Mobility ?  ? ?Modified Rankin (Stroke Patients Only) ?  ? ? ?  ?Balance Overall balance assessment: Needs assistance ?Sitting-balance support: Feet supported, No upper extremity supported ?Sitting balance-Leahy Scale: Good ?  ?  ?Standing balance support: During functional activity, Bilateral upper extremity supported, Reliant on assistive device for balance ?Standing balance-Leahy Scale: Good ?  ?  ?  ?  ?  ?  ?  ?  ?  ?  ?  ?  ?  ? ?  ?Cognition Arousal/Alertness: Awake/alert ?Behavior During Therapy: [facility] for tasks assessed/performed ?Overall Cognitive Status: Within Functional Limits for tasks assessed ?  ?  ?  ?  ?  ?  ?  ?  ?  ?  ?  ?  ?  ?  ?  ?  ?  General Comments: requires continuous cueing on proper hand placement and precautions, A&Ox3: self, location, situation ?  ?  ? ?  ?Exercises Total Joint Exercises ?Straight Leg Raises:  Supine, AAROM, 10 reps, Right ?Other Exercises ?Other Exercises: Patient was educated on Role of PT in acute setting, fall risk, WBing precautions, Hip precautions, and D/C recommendations ? ?  ?General Comments   ?  ?  ? ?Pertinent Vitals/Pain Pain Assessment ?Pain Assessment: 0-10 ?Pain Score: 6  ?Pain Location: R hip ?Pain Descriptors / Indicators: Discomfort, Constant ?Pain Intervention(s): Monitored during session, Repositioned  ? ? ?Home Living Family/patient expects to be discharged to:: Private residence ?Living Arrangements: Spouse/significant other ?Available Help at Discharge: Family ?Type of Home: House ?Home Access: Stairs to enter ?  ?Entrance Stairs-Number of Steps: small ledge ?  ?Home Layout: Multi-level;Able to live on main level with bedroom/bathroom ?Home Equipment: BSC/3in1;Rolling Walker (2 wheels);Cane - single point;Grab bars - tub/shower ?   ?  ?Prior Function    ?  ?  ?   ? ?PT Goals (current goals can now be found in the care plan section) Acute Rehab PT Goals ?Patient Stated Goal: to go home ?PT Goal Formulation: With patient ?Time For Goal Achievement: 04/17/22 ?Potential to Achieve Goals: Good ?Progress towards PT goals: Progressing toward goals ? ?  ?Frequency ? ? ? BID ? ? ? ?  ?PT Plan Current plan remains appropriate  ? ? ?Co-evaluation   ?  ?  ?  ?  ? ?  ?AM-PAC PT "6 Clicks" Mobility   ?Outcome Measure ? Help needed turning from your back to your side while in a flat bed without using bedrails?: A Little ?Help needed moving from lying on your back to sitting on the side of a flat bed without using bedrails?: A Little ?Help needed moving to and from a bed to a chair (including a wheelchair)?: A Little ?Help needed standing up from a chair using your arms (e.g., wheelchair or bedside chair)?: A Little ?Help needed to walk in hospital room?: A Little ?Help needed climbing 3-5 steps with a railing? : A Little ?6 Click Score: 18 ? ?  ?End of Session Equipment Utilized During Treatment:  Gait belt ?Activity Tolerance: Patient tolerated treatment well ?Patient left: in chair;with call bell/phone within reach;with chair alarm set;with family/visitor present ?Nurse Communication: Mobility status ?PT Visit Diagnosis: Unsteadiness on feet (R26.81);Muscle weakness (generalized) (M62.81);Difficulty in walking, not elsewhere classified (R26.2);Pain ?Pain - Right/Left: Right ?Pain - part of body: Hip ?  ? ? ?Time: 9977-4142 ?PT Time Calculation (min) (ACUTE ONLY): 14 min ? ?Charges:  $Therapeutic Activity: 8-22 mins          ?          ? ?Iva Boop, PT  ?04/03/22. 1:12 PM ? ? ?

## 2022-04-03 NOTE — Evaluation (Signed)
Occupational Therapy Evaluation ?Patient Details ?Name: Clayton Parker ?MRN: [id number] ?DOB: 1941/07/05 ?Today's Date: 04/03/2022 ? ? ?History of Present Illness Patient is an 81 year old male s/p right THA (posterior approach)  ? ?Clinical Impression ?  ?Mr Taha was seen for OT evaluation this date, POD#1 from above surgery. Pt was independent in all mobility prior to surgery, however assist for LBD. Lives with wife, available 24/7. Pt currently requires MOD A for LB dressing while in seated position. Pt recalls 2/3 posterior total hip precautions at start of session. Pt instructed in posterior total hip precautions, falls prevention strategies, home/routines modifications, and car transfer techniques. SUPERVISION + RW for ADL t/f, tolerates ~200 ft mobility and grooming standing sink side. All education complete, will sign off. Recommend no OT follow up upon discharge.     ?   ? ?Recommendations for follow up therapy are one component of a multi-disciplinary discharge planning process, led by the attending physician.  Recommendations may be updated based on patient status, additional functional criteria and insurance authorization.  ? ?Follow Up Recommendations ? No OT follow up  ?  ?Assistance Recommended at Discharge Set up Supervision/Assistance  ?Patient can return home with the following A little help with bathing/dressing/bathroom;Help with stairs or ramp for entrance ? ?  ?Functional Status Assessment ? Patient has had a recent decline in their functional status and demonstrates the ability to make significant improvements in function in a reasonable and predictable amount of time.  ?Equipment Recommendations ? None recommended by OT  ?  ?Recommendations for Other Services   ? ? ?  ?Precautions / Restrictions Precautions ?Precautions: Posterior Hip;Fall ?Restrictions ?Weight Bearing Restrictions: Yes ?RLE Weight Bearing: Weight bearing as tolerated  ? ?  ? ?Mobility Bed Mobility ?  ?  ?  ?  ?  ?  ?   ?General bed mobility comments: received and left in bed ?  ? ?Transfers ?Overall transfer level: Needs assistance ?Equipment used: Rolling walker (2 wheels) ?Transfers: Sit to/from Stand ?Sit to Stand: Supervision ?  ?  ?  ?  ?  ?General transfer comment: cues ?  ? ?  ?Balance Overall balance assessment: Needs assistance ?Sitting-balance support: Feet supported, No upper extremity supported ?Sitting balance-Leahy Scale: Good ?  ?  ?Standing balance support: No upper extremity supported, During functional activity ?Standing balance-Leahy Scale: Good ?  ?  ?  ?  ?  ?  ?  ?  ?  ?  ?  ?  ?   ? ?ADL either performed or assessed with clinical judgement  ? ?ADL Overall ADL's : Needs assistance/impaired ?  ?  ?  ?  ?  ?  ?  ?  ?  ?  ?  ?  ?  ?  ?  ?  ?  ?  ?  ?General ADL Comments: MAX A don B socks seated EOC. SUPERVISION + RW for ADL t/f and grooming standing sink side  ? ? ? ? ?Pertinent Vitals/Pain Pain Assessment ?Pain Assessment: 0-10 ?Pain Score: 5  ?Pain Location: R hip ?Pain Descriptors / Indicators: Discomfort, Constant ?Pain Intervention(s): Limited activity within patient's tolerance, Repositioned  ? ? ? ?Hand Dominance Right ?  ?Extremity/Trunk Assessment Upper Extremity Assessment ?Upper Extremity Assessment: Overall WFL for tasks assessed ?  ?Lower Extremity Assessment ?Lower Extremity Assessment: Overall WFL for tasks assessed ?  ?  ?  ?Communication Communication ?Communication: HOH ?  ?Cognition Arousal/Alertness: Awake/alert ?Behavior During Therapy: [facility] for tasks assessed/performed ?Overall Cognitive  Status: Within Functional Limits for tasks assessed ?  ?  ?  ?  ?  ?  ?  ?  ?  ?  ?  ?  ?  ?  ?  ?  ?  ?  ?  ? ?Home Living Family/patient expects to be discharged to:: Private residence ?Living Arrangements: Spouse/significant other ?Available Help at Discharge: Family ?Type of Home: House ?Home Access: Stairs to enter ?Entrance Stairs-Number of Steps: small ledge ?  ?Home Layout: Multi-level;Able to live  on main level with bedroom/bathroom ?  ?  ?Bathroom Shower/Tub: Walk-in shower ?  ?Bathroom Toilet: Standard ?  ?  ?Home Equipment: BSC/3in1;Rolling Walker (2 wheels);Cane - single point;Grab bars - tub/shower ?  ?  ?  ? ?  ?Prior Functioning/Environment Prior Level of Function : Independent/Modified Independent ?  ?  ?  ?  ?  ?  ?Mobility Comments: no AD at baseline ?ADLs Comments: wife assists with socks ?  ? ?  ?  ?OT Problem List: Decreased range of motion;Decreased safety awareness ?  ?   ?   ?OT Goals(Current goals can be found in the care plan section) Acute Rehab OT Goals ?Patient Stated Goal: to go home ?OT Goal Formulation: With patient/family ?Time For Goal Achievement: 04/17/22 ?Potential to Achieve Goals: Good  ? ?AM-PAC OT "6 Clicks" Daily Activity     ?Outcome Measure Help from another person eating meals?: None ?Help from another person taking care of personal grooming?: None ?Help from another person toileting, which includes using toliet, bedpan, or urinal?: A Little ?Help from another person bathing (including washing, rinsing, drying)?: A Little ?Help from another person to put on and taking off regular upper body clothing?: None ?Help from another person to put on and taking off regular lower body clothing?: A Lot ?6 Click Score: 20 ?  ?End of Session Equipment Utilized During Treatment: Rolling walker (2 wheels) ?Nurse Communication: Mobility status ? ?Activity Tolerance: Patient tolerated treatment well ?Patient left: in chair;with call bell/phone within reach;with nursing/sitter in room;with family/visitor present ? ?OT Visit Diagnosis: Unsteadiness on feet (R26.81);Muscle weakness (generalized) (M62.81)  ?              ?Time: 5361-4431 ?OT Time Calculation (min): 14 min ?Charges:  OT General Charges ?$OT Visit: 1 Visit ?OT Evaluation ?$OT Eval Low Complexity: 1 Low ? ?Kathie Dike, M.S. OTR/L  ?04/03/22, 12:42 PM  ?ascom [phone] ? ?

## 2022-04-03 NOTE — Progress Notes (Signed)
Reviewed discharge incrustations with pt. Wife at bedside. Pt verbalized understanding. Pt discharged with all personal belongings. Compression stockings on BLE. Extra honeycomb dressing to discharge. Staff wheeled pt out. Pt transported to home via family vehicle.  ?

## 2022-04-03 NOTE — Evaluation (Signed)
Physical Therapy Evaluation ?Patient Details ?Name: Clayton Parker ?MRN: [id number] ?DOB: 22-Aug-1941 ?Today's Date: 04/03/2022 ? ?History of Present Illness ? Patient is an 81 year old male s/p right THA (posterior approach)  ?Clinical Impression ? Physical Therapy Evaluation completed on this date. Patient tolerated session well and was agreeable to treatment. Only mild pain reported throughout session (3/10), and patient was A&Ox3. Patient lives in a multi-level home with his wife, with no steps to enter. Patient will be able to live on the main level upon return home, and wife is able to assist 24/7. Patient did not use any AD prior to hospitalization, and was Independent with all ADLs/Mobility prior to hospitalization.  ? ?Patient demonstrated at least 3+/5 strength in BLEs, and decreased ROM on R due to posterior hip precautions. Patient required Min A for all bed mobility, with assistance at either the trunk or BLEs in order to maintain precautions. He required continuous cueing on proper hand placement when completing sit to stands from EOB and toilet, and cueing to keep RW within BOS when completing transfers vs leaving it outside BOS. Patient was able to tolerate 2 laps around the nurses station at CGA/SBA with RW and no LOB noted. HEP was reviewed with patient. Per patient and wife, patient is already familiar with most exercises as he participated in PT prior to surgery. Patient was left in bed with all needs met and family present. Patient would continue to benefit from skilled physical therapy in order to optimize patient's return to PLOF. Recommend HHPT upon discharge from acute hospitalization.  ? ?Recommendations for follow up therapy are one component of a multi-disciplinary discharge planning process, led by the attending physician.  Recommendations may be updated based on patient status, additional functional criteria and insurance authorization. ? ?Follow Up Recommendations Home health PT ? ?   ?Assistance Recommended at Discharge Intermittent Supervision/Assistance  ?Patient can return home with the following ? A little help with walking and/or transfers;A little help with bathing/dressing/bathroom;Assist for transportation ? ?  ?Equipment Recommendations None recommended by PT (patient has all required DME at home)  ?Recommendations for Other Services ?    ?  ?Functional Status Assessment Patient has had a recent decline in their functional status and demonstrates the ability to make significant improvements in function in a reasonable and predictable amount of time.  ? ?  ?Precautions / Restrictions Precautions ?Precautions: Posterior Hip;Fall ?Precaution Booklet Issued: Yes (comment) (left in room) ?Restrictions ?Weight Bearing Restrictions: Yes ?RLE Weight Bearing: Weight bearing as tolerated  ? ?  ? ?Mobility ? Bed Mobility ?Overal bed mobility: Needs Assistance ?Bed Mobility: Supine to Sit, Sit to Supine ?  ?  ?Supine to sit: Min assist (at the trunk) ?Sit to supine: Min assist (assistance with repositioning back in bed, patient hooked LLE under RLE to get back into bed) ?  ?  ?  ? ?Transfers ?Overall transfer level: Needs assistance ?Equipment used: Rolling walker (2 wheels) ?Transfers: Sit to/from Stand ?Sit to Stand: Min guard (SBA) ?  ?  ?  ?  ?  ?General transfer comment: CGA from EOB, SBA from 3&1 toliet seat ?  ? ?Ambulation/Gait ?Ambulation/Gait assistance: Min guard (SBA) ?Gait Distance (Feet): 320 Feet ?Assistive device: Rolling walker (2 wheels) ?Gait Pattern/deviations: Step-through pattern, Decreased step length - right, Decreased step length - left, Decreased stride length, Antalgic, Narrow base of support ?Gait velocity: decreased ?  ?  ?General Gait Details: no LOB noted ? ?Stairs ?  ?  ?  ?  ?  ? ?  Wheelchair Mobility ?  ? ?Modified Rankin (Stroke Patients Only) ?  ? ?  ? ?Balance Overall balance assessment: Needs assistance ?Sitting-balance support: Bilateral upper extremity  supported, Feet supported ?Sitting balance-Leahy Scale: Fair ?  ?  ?Standing balance support: Bilateral upper extremity supported, During functional activity, Reliant on assistive device for balance ?Standing balance-Leahy Scale: Fair ?  ?  ?  ?  ?  ?  ?  ?  ?  ?  ?  ?  ?   ? ? ? ?Pertinent Vitals/Pain Pain Assessment ?Pain Assessment: 0-10 ?Pain Score: 3  ?Pain Location: R hip ?Pain Descriptors / Indicators: Discomfort, Constant ?Pain Intervention(s): Monitored during session, Repositioned  ? ? ?Home Living Family/patient expects to be discharged to:: Private residence ?Living Arrangements: Spouse/significant other ?Available Help at Discharge: Family ?Type of Home: House ?Home Access: Stairs to enter ?  ?Entrance Stairs-Number of Steps: small ledge ?  ?Home Layout: Multi-level;Able to live on main level with bedroom/bathroom ?Home Equipment: BSC/3in1;Rolling Walker (2 wheels);Cane - single point;Grab bars - tub/shower ?   ?  ?Prior Function Prior Level of Function : Independent/Modified Independent ?  ?  ?  ?  ?  ?  ?Mobility Comments: no AD at baseline ?ADLs Comments: did need assistance putting socks on ?  ? ? ?Hand Dominance  ? Dominant Hand: Right ? ?  ?Extremity/Trunk Assessment  ? Upper Extremity Assessment ?Upper Extremity Assessment: Generalized weakness ?  ? ?Lower Extremity Assessment ?Lower Extremity Assessment: Generalized weakness (At least 3+/5 strength bilaterally, decreased ROM on RLE due to ROM/Hip restrictions) ?  ? ?   ?Communication  ? Communication: HOH  ?Cognition Arousal/Alertness: Awake/alert ?Behavior During Therapy: [facility] for tasks assessed/performed ?Overall Cognitive Status: Within Functional Limits for tasks assessed ?  ?  ?  ?  ?  ?  ?  ?  ?  ?  ?  ?  ?  ?  ?  ?  ?General Comments: requires continuous cueing on proper hand placement and precautions, A&Ox3: self, location, situation ?  ?  ? ?  ?General Comments   ? ?  ?Exercises Other Exercises ?Other Exercises: Patient was educated on  Role of PT in acute setting, fall risk, WBing precautions, Hip precautions, and D/C recommendations  ? ?Assessment/Plan  ?  ?PT Assessment Patient needs continued PT services  ?PT Problem List Decreased strength;Decreased balance;Decreased knowledge of precautions;Pain;Decreased range of motion;Decreased mobility;Decreased activity tolerance;Decreased knowledge of use of DME ? ?   ?  ?PT Treatment Interventions DME instruction;Functional mobility training;Balance training;Gait training;Therapeutic activities;Therapeutic exercise;Patient/family education   ? ?PT Goals (Current goals can be found in the Care Plan section)  ?Acute Rehab PT Goals ?Patient Stated Goal: to go home ?PT Goal Formulation: With patient ?Time For Goal Achievement: 04/17/22 ?Potential to Achieve Goals: Good ? ?  ?Frequency BID ?  ? ? ?Co-evaluation   ?  ?  ?  ?  ? ? ?  ?AM-PAC PT "6 Clicks" Mobility  ?Outcome Measure Help needed turning from your back to your side while in a flat bed without using bedrails?: A Little ?Help needed moving from lying on your back to sitting on the side of a flat bed without using bedrails?: A Little ?Help needed moving to and from a bed to a chair (including a wheelchair)?: A Little ?Help needed standing up from a chair using your arms (e.g., wheelchair or bedside chair)?: A Little ?Help needed to walk in hospital room?: A Little ?Help needed climbing 3-5 steps with  a railing? : A Lot ?6 Click Score: 17 ? ?  ?End of Session Equipment Utilized During Treatment: Gait belt ?Activity Tolerance: Patient tolerated treatment well ?Patient left: in bed;with call bell/phone within reach;with bed alarm set;with family/visitor present ?Nurse Communication: Mobility status ?PT Visit Diagnosis: Unsteadiness on feet (R26.81);Muscle weakness (generalized) (M62.81);Difficulty in walking, not elsewhere classified (R26.2);Pain ?Pain - Right/Left: Right ?Pain - part of body: Hip ?  ? ?Time: 0827-0910 ?PT Time Calculation (min)  (ACUTE ONLY): 43 min ? ? ?Charges:   PT Evaluation ?$PT Eval Low Complexity: 1 Low ?PT Treatments ?$Gait Training: 8-22 mins ?$Therapeutic Activity: 8-22 mins ?  ?   ? ? ?Iva Boop, PT  ?04/03/22. 10:44 AM ?

## 2022-04-04 LAB — SURGICAL PATHOLOGY

## 2022-10-28 ENCOUNTER — Emergency Department: Payer: Medicare Other

## 2022-10-28 ENCOUNTER — Observation Stay
Admission: EM | Admit: 2022-10-28 | Discharge: 2022-10-30 | Disposition: A | Discharge status: Home / Self Care | Payer: Medicare Other | Attending: Internal Medicine | Admitting: Internal Medicine

## 2022-10-28 ENCOUNTER — Encounter: Payer: Self-pay | Admitting: Emergency Medicine

## 2022-10-28 DIAGNOSIS — Z7982 Long term (current) use of aspirin: Secondary | ICD-10-CM | POA: Insufficient documentation

## 2022-10-28 DIAGNOSIS — R4182 Altered mental status, unspecified: Principal | ICD-10-CM | POA: Insufficient documentation

## 2022-10-28 DIAGNOSIS — R7989 Other specified abnormal findings of blood chemistry: Secondary | ICD-10-CM | POA: Insufficient documentation

## 2022-10-28 DIAGNOSIS — A419 Sepsis, unspecified organism: Secondary | ICD-10-CM | POA: Diagnosis not present

## 2022-10-28 DIAGNOSIS — G459 Transient cerebral ischemic attack, unspecified: Secondary | ICD-10-CM

## 2022-10-28 DIAGNOSIS — Z96641 Presence of right artificial hip joint: Secondary | ICD-10-CM | POA: Insufficient documentation

## 2022-10-28 DIAGNOSIS — I2510 Atherosclerotic heart disease of native coronary artery without angina pectoris: Secondary | ICD-10-CM | POA: Diagnosis not present

## 2022-10-28 DIAGNOSIS — R6510 Systemic inflammatory response syndrome (SIRS) of non-infectious origin without acute organ dysfunction: Secondary | ICD-10-CM | POA: Diagnosis not present

## 2022-10-28 DIAGNOSIS — Z79899 Other long term (current) drug therapy: Secondary | ICD-10-CM | POA: Diagnosis not present

## 2022-10-28 DIAGNOSIS — I129 Hypertensive chronic kidney disease with stage 1 through stage 4 chronic kidney disease, or unspecified chronic kidney disease: Secondary | ICD-10-CM | POA: Diagnosis not present

## 2022-10-28 DIAGNOSIS — W19XXXA Unspecified fall, initial encounter: Secondary | ICD-10-CM

## 2022-10-28 DIAGNOSIS — Z7984 Long term (current) use of oral hypoglycemic drugs: Secondary | ICD-10-CM | POA: Insufficient documentation

## 2022-10-28 DIAGNOSIS — E1122 Type 2 diabetes mellitus with diabetic chronic kidney disease: Secondary | ICD-10-CM | POA: Diagnosis not present

## 2022-10-28 DIAGNOSIS — R4701 Aphasia: Secondary | ICD-10-CM | POA: Diagnosis present

## 2022-10-28 DIAGNOSIS — N189 Chronic kidney disease, unspecified: Secondary | ICD-10-CM | POA: Insufficient documentation

## 2022-10-28 DIAGNOSIS — K219 Gastro-esophageal reflux disease without esophagitis: Secondary | ICD-10-CM | POA: Diagnosis present

## 2022-10-28 DIAGNOSIS — R7402 Elevation of levels of lactic acid dehydrogenase (LDH): Secondary | ICD-10-CM | POA: Insufficient documentation

## 2022-10-28 DIAGNOSIS — Z955 Presence of coronary angioplasty implant and graft: Secondary | ICD-10-CM | POA: Diagnosis not present

## 2022-10-28 DIAGNOSIS — I10 Essential (primary) hypertension: Secondary | ICD-10-CM | POA: Diagnosis present

## 2022-10-28 DIAGNOSIS — R404 Transient alteration of awareness: Principal | ICD-10-CM

## 2022-10-28 DIAGNOSIS — R55 Syncope and collapse: Secondary | ICD-10-CM | POA: Diagnosis not present

## 2022-10-28 LAB — COMPREHENSIVE METABOLIC PANEL
ALT: 27 U/L (ref 0–44)
AST: 34 U/L (ref 15–41)
Albumin: 3.9 g/dL (ref 3.5–5.0)
Alkaline Phosphatase: 87 U/L (ref 38–126)
Anion gap: 10 (ref 5–15)
BUN: 33 mg/dL — ABNORMAL HIGH (ref 8–23)
CO2: 24 mmol/L (ref 22–32)
Calcium: 9.4 mg/dL (ref 8.9–10.3)
Chloride: 108 mmol/L (ref 98–111)
Creatinine, Ser: 1.03 mg/dL (ref 0.61–1.24)
GFR, Estimated: >60 mL/min (ref >60)
Glucose, Bld: 112 mg/dL — ABNORMAL HIGH (ref 70–99)
Potassium: 4.3 mmol/L (ref 3.5–5.1)
Sodium: 142 mmol/L (ref 135–145)
Total Bilirubin: 0.8 mg/dL (ref 0.3–1.2)
Total Protein: 7 g/dL (ref 6.5–8.1)

## 2022-10-28 LAB — PROTIME-INR
INR: 1 (ref 0.8–1.2)
Prothrombin Time: 13.2 seconds (ref 11.4–15.2)

## 2022-10-28 LAB — DIFFERENTIAL
Abs Immature Granulocytes: 0.03 10*3/uL (ref 0.00–0.07)
Basophils Absolute: 0 10*3/uL (ref 0.0–0.1)
Basophils Relative: 0 %
Eosinophils Absolute: 0.4 10*3/uL (ref 0.0–0.5)
Eosinophils Relative: 4 %
Immature Granulocytes: 0 %
Lymphocytes Relative: 26 %
Lymphs Abs: 2.6 10*3/uL (ref 0.7–4.0)
Monocytes Absolute: 0.8 10*3/uL (ref 0.1–1.0)
Monocytes Relative: 8 %
Neutro Abs: 6.3 10*3/uL (ref 1.7–7.7)
Neutrophils Relative %: 62 %

## 2022-10-28 LAB — TROPONIN I (HIGH SENSITIVITY)
Troponin I (High Sensitivity): 4 ng/L (ref <18)
Troponin I (High Sensitivity): 6 ng/L (ref <18)

## 2022-10-28 LAB — CBC
HCT: 38.9 % — ABNORMAL LOW (ref 39.0–52.0)
Hemoglobin: 12.5 g/dL — ABNORMAL LOW (ref 13.0–17.0)
MCH: 29.1 pg (ref 26.0–34.0)
MCHC: 32.1 g/dL (ref 30.0–36.0)
MCV: 90.5 fL (ref 80.0–100.0)
Platelets: 262 10*3/uL (ref 150–400)
RBC: 4.3 MIL/uL (ref 4.22–5.81)
RDW: 14.3 % (ref 11.5–15.5)
WBC: 10.1 10*3/uL (ref 4.0–10.5)
nRBC: 0 % (ref 0.0–0.2)

## 2022-10-28 LAB — CBG MONITORING, ED: Glucose-Capillary: 102 mg/dL — ABNORMAL HIGH (ref 70–99)

## 2022-10-28 LAB — ETHANOL: Alcohol, Ethyl (B): <10 mg/dL (ref <10)

## 2022-10-28 LAB — LACTIC ACID, PLASMA
Lactic Acid, Venous: 3.8 mmol/L (ref 0.5–1.9)
Lactic Acid, Venous: 2.8 mmol/L (ref 0.5–1.9)

## 2022-10-28 LAB — APTT: aPTT: 25 seconds (ref 24–36)

## 2022-10-28 MED ORDER — LACTATED RINGERS IV BOLUS (SEPSIS)
1000.0000 mL | Freq: Once | INTRAVENOUS | Status: AC
  Administered 2022-10-28: 1000 mL via INTRAVENOUS

## 2022-10-28 MED ORDER — VANCOMYCIN HCL 1500 MG/300ML IV SOLN
1500.0000 mg | Freq: Once | INTRAVENOUS | Status: AC
  Administered 2022-10-29: 1500 mg via INTRAVENOUS
  Filled 2022-10-28: qty 300

## 2022-10-28 MED ORDER — VANCOMYCIN HCL IN DEXTROSE 1-5 GM/200ML-% IV SOLN: 1000.0000 mg | Freq: Once | INTRAVENOUS | Status: DC

## 2022-10-28 MED ORDER — SODIUM CHLORIDE 0.9 % IV SOLN
2.0000 g | Freq: Once | INTRAVENOUS | Status: AC
  Administered 2022-10-28: 2 g via INTRAVENOUS
  Filled 2022-10-28: qty 12.5

## 2022-10-28 MED ORDER — ASPIRIN 81 MG PO CHEW
324.0000 mg | CHEWABLE_TABLET | Freq: Once | ORAL | Status: AC
  Administered 2022-10-28: 324 mg via ORAL
  Filled 2022-10-28: qty 4

## 2022-10-28 NOTE — ED Triage Notes (Signed)
Pt arrived via [facility] from home as a called in field: CODE STROKE due to expressive aphagia, not able to follow commands as well as bilateral weakness to lower extremities. Per EMS, initial LVO=2, on arrival LVO=0. Pt assessed by Jacqualine Code, MD on arrival with CBG taken=102 and then taken straight to CT room with assigned RN.

## 2022-10-28 NOTE — Progress Notes (Signed)
Code Stroke Activated @2107  Patient to CT @2107  Patient Returned to Room @2114  TeleNeurologist Paged @2114  Dr. Samuel Germany on stroke cart @2124  CT Head results given to Dr. Samuel Germany on camera @2135  mRS 0

## 2022-10-28 NOTE — Sepsis Progress Note (Signed)
Elink following for Sepsis Protocol 

## 2022-10-28 NOTE — Progress Notes (Signed)
PHARMACY -  BRIEF ANTIBIOTIC NOTE   Pharmacy has received consult(s) for Cefepime & Vancomycin from an ED provider.  The patient's profile has been reviewed for ht/wt/allergies/indication/available labs.    One time order(s) placed for Cefepime 2 gm and Vancomycin 1500 mg per pet wt: 65.3 kg  Further antibiotics/pharmacy consults should be ordered by admitting physician if indicated.                       Thank you, Renda Rolls, PharmD, [facility] 10/28/2022 10:29 PM

## 2022-10-28 NOTE — Progress Notes (Signed)
Chaplain responded to Code Stroke.  Pt not yet available.  Please contact if family arrives or support is needed.  Minus Liberty, [place] Pager:  [phone]    10/28/22 2100  Clinical Encounter Type  Visited With Patient not available  Visit Type Initial;Code  Referral From Nurse  Consult/Referral To Chaplain  Stress Factors  Patient Stress Factors Health changes

## 2022-10-28 NOTE — Consult Note (Signed)
[facility] Tele[facility]rology Consult Services   Patient Name:   Clayton Parker, Clayton Parker Date of Birth:   Apr 22, 1941 Identification Number:   MRN - [id number] Date of Service:   10/28/2022 21:14:52  Diagnosis:       R53.1 - Weakness  Impression:      81yo man w/PMH of HTN, DM p/w aphasia, bilateral leg weakness on 10/28/22. He states he was watching tv at home when suddenly EMS was there. He is not sure why he is at the hospital. He currently denies complaints, but then admits to weakness in both legs intermittently for the past week and leaning on furniture. He is not sure when he last felt normal. EMS reported they were called because he was not speaking. EMS noted aphasia and bilateral leg drift. He reportedly had a fall on 10/23/22 and was found on the ground for an unknown period of time. NIHSS 0, exam notable for unsteady gait. CT Head has no acute findings. Pt is not a candidate for thrombolytics due to being out of the 4.5 hour window. Differential for this presentation includes but is not limited to TIA, acute ischemic stroke, complex partial seizure, infectious or metabolic encephalopathy, concussion. Plan listed below was recommended to the ED physician by phone.  Our recommendations are outlined below.  Recommendations:        Stroke/Telemetry Floor       Neuro Checks       Bedside Swallow Eval       DVT Prophylaxis       IV Fluids, Normal Saline       Initiate or continue Aspirin 81 MG daily       Optimize blood pressure, temp, glucose       MRI Brain without contrast to assess for stroke       If MRI is positive, vessel imaging and TTE will also be needed       Lipid Profile, A1C, UA       PT/OT, Speech/Swallow evaluation       Infectious/metabolic workup/management as per primary team  Sign Out:       Discussed with Emergency Department Provider    ------------------------------------------------------------------------------  Advanced Imaging: Advanced  Imaging Deferred because: Does not meet criteria due to being out of the 24-hour window for thrombectomy   Metrics: Last Known Well: Unknown [facility] Notification Time: 10/28/2022 21:14:52 Arrival Time: 10/28/2022 21:03:00 Stamp Time: 10/28/2022 21:14:52 Initial Response Time: 10/28/2022 21:22:15 Symptoms: aphasia, bilateral leg weakness. Initial patient interaction: 10/28/2022 21:25:52 NIHSS Assessment Completed: 10/28/2022 21:33:28 Patient is not a candidate for Thrombolytic. Thrombolytic Medical Decision: 10/28/2022 21:36:43 Patient was not deemed candidate for Thrombolytic because of following reasons: Last Well Known Above 4.5 Hours.  CT head showed no acute hemorrhage or acute core infarct.  Primary Provider Notified of Diagnostic Impression and Management Plan on: 10/28/2022 21:49:08    ------------------------------------------------------------------------------  History of Present Illness: Patient is a 81 year old Male.  Patient was brought by EMS for symptoms of aphasia, bilateral leg weakness. 81yo man w/PMH of HTN, DM p/w aphasia, bilateral leg weakness on 10/28/22. He states he was watching tv at home when suddenly EMS was there. He is not sure why he is at the hospital. He currently denies complaints, but then admits to weakness in both legs intermittently for the past week and leaning on furniture. He is not sure when he last felt normal. EMS reported they were called because he was not speaking. EMS noted aphasia and bilateral leg drift. He  reportedly had a fall on 10/23/22 and was found on the ground for an unknown period of time.   Past Medical History:  see hpi  Medications: No Anticoagulant use  No Antiplatelet use Reviewed EMR for current medications  Allergies:  Reviewed  Social History: Drug Use: No  Family History Cannot Be Obtained Because:Patient Is Confused  ROS : ROS Cannot Be Obtained Because:  Patient Is Confused  Past Surgical  History: Past Surgical History Cannot Be Obtained Because: Patient Is Confused    Examination: BP(/), Pulse(80), 1A: Level of Consciousness - Alert; keenly responsive + 0 1B: Ask Month and Age - Both Questions Right + 0 1C: Blink Eyes & Squeeze Hands - Performs Both Tasks + 0 2: Test Horizontal Extraocular Movements - Normal + 0 3: Test Visual Fields - No Visual Loss + 0 4: Test Facial Palsy (Use Grimace if Obtunded) - Normal symmetry + 0 5A: Test Left Arm Motor Drift - No Drift for 10 Seconds + 0 5B: Test Right Arm Motor Drift - No Drift for 10 Seconds + 0 6A: Test Left Leg Motor Drift - No Drift for 5 Seconds + 0 6B: Test Right Leg Motor Drift - No Drift for 5 Seconds + 0 7: Test Limb Ataxia (FNF/Heel-Shin) - No Ataxia + 0 8: Test Sensation - Normal; No sensory loss + 0 9: Test Language/Aphasia - Normal; No aphasia + 0 10: Test Dysarthria - Normal + 0 11: Test Extinction/Inattention - No abnormality + 0  NIHSS Score: 0  NIHSS Free Text : unstady gait  Pre-Morbid Modified Rankin Scale: 0 Points = No symptoms at all  Spoke with : ED MD  Patient/Family was informed the Neurology Consult would occur via TeleHealth consult by way of interactive audio and video telecommunications and consented to receiving care in this manner.   Patient is being evaluated for possible acute neurologic impairment and high probability of imminent or life-threatening deterioration. I spent total of 35 minutes providing care to this patient, including time for face to face visit via telemedicine, review of medical records, imaging studies and discussion of findings with providers, the patient and/or family.   Dr Estil Daft   [facility] For Inpatient follow-up with [facility] physician please call [facility] [phone]. This is not an outpatient service. Post hospital discharge, please contact hospital directly.

## 2022-10-28 NOTE — Progress Notes (Signed)
CODE SEPSIS - PHARMACY COMMUNICATION  **Broad Spectrum Antibiotics should be administered within 1 hour of Sepsis diagnosis**  Time Code Sepsis Called/Page Received: 2226  Antibiotics Ordered: Cefepime & Vancomycin  Time of 1st antibiotic administration: 2316  Renda Rolls, PharmD, [facility] 10/28/2022 10:28 PM

## 2022-10-28 NOTE — ED Provider Notes (Signed)
[facility] Provider Note    None    (approximate)   History   Code Stroke   HPI  Clayton Parker is a 81 y.o. male with a history of CAD, type 2 diabetes, hypertension, hypercholesterolemia, and GERD who presents with an episode of aphasia per EMS, with last known well around 7:30 PM.  The patient himself denies any acute symptoms and is not sure why he is here.  EMS got an LVO score of 2 while they were transporting him and it is now 0.  The patient denies headache, weakness or numbness, difficulty speaking, vision changes, or any other acute symptoms.  I reviewed the past medical records.  The patient's most recent outpatient visit was on 10/30 with family medicine for follow-up of diabetes and hypertension.  He had no acute issues at that time.  His last inpatient admission was in April 2023 for right hip arthroplasty by Dr. Ernest Pine.   Physical Exam   Triage Vital Signs: ED Triage Vitals  Enc Vitals Group     BP      Pulse      Resp      Temp      Temp src      SpO2      Weight      Height      Head Circumference      Peak Flow      Pain Score      Pain Loc      Pain Edu?      Excl. in GC?     Most recent vital signs: Vitals:   10/28/22 2300 10/28/22 2323  BP:  138/80  Pulse: 87 94  Resp: 18 17  Temp:    SpO2: 98% 99%     General: Awake, oriented x2, no distress.  CV:  Good peripheral perfusion.  Resp:  Normal effort.  Abd:  No distention.  Other:  EOMI.  PERRLA.  Motor and sensory intact in all extremities.  No facial droop.  No pronator drift.  No ataxia on finger-to-nose.  Slightly confused appearing and hesitant to follow some commands.   ED Results / Procedures / Treatments   Labs (all labs ordered are listed, but only abnormal results are displayed) Labs Reviewed  CBC - Abnormal; Notable for the following components:      Result Value   Hemoglobin 12.5 (*)    HCT 38.9 (*)    All other components within normal  limits  COMPREHENSIVE METABOLIC PANEL - Abnormal; Notable for the following components:   Glucose, Bld 112 (*)    BUN 33 (*)    All other components within normal limits  LACTIC ACID, PLASMA - Abnormal; Notable for the following components:   Lactic Acid, Venous 3.8 (*)    All other components within normal limits  LACTIC ACID, PLASMA - Abnormal; Notable for the following components:   Lactic Acid, Venous 2.8 (*)    All other components within normal limits  CBG MONITORING, ED - Abnormal; Notable for the following components:   Glucose-Capillary 102 (*)    All other components within normal limits  CULTURE, BLOOD (ROUTINE X 2)  CULTURE, BLOOD (ROUTINE X 2)  PROTIME-INR  APTT  DIFFERENTIAL  ETHANOL  URINALYSIS, ROUTINE W REFLEX MICROSCOPIC  CBG MONITORING, ED  TROPONIN I (HIGH SENSITIVITY)  TROPONIN I (HIGH SENSITIVITY)     EKG  ED ECG REPORT I, Dionne Bucy, the attending physician, personally viewed and interpreted  this ECG.  Date: 10/28/2022 EKG Time: 2121 Rate: 104 Rhythm: Sinus tachycardia QRS Axis: normal Intervals: normal ST/T Wave abnormalities: normal Narrative Interpretation: no evidence of acute ischemia    RADIOLOGY  CT head: I independently viewed and interpreted the images; there is no ICH.  Radiology report indicates no evidence of acute stroke.  PROCEDURES:  Critical Care performed: Yes, see critical care procedure note(s)  .Critical Care  Performed by: Arta Silence, MD Authorized by: Arta Silence, MD   Critical care provider statement:    Critical care time (minutes):  30   Critical care time was exclusive of:  Separately billable procedures and treating other patients   Critical care was necessary to treat or prevent imminent or life-threatening deterioration of the following conditions:  CNS failure or compromise   Critical care was time spent personally by me on the following activities:  Development of treatment plan  with patient or surrogate, discussions with consultants, evaluation of patient's response to treatment, examination of patient, ordering and review of laboratory studies, ordering and review of radiographic studies, ordering and performing treatments and interventions, pulse oximetry, re-evaluation of patient's condition and review of old charts   Care discussed with: admitting provider      MEDICATIONS ORDERED IN ED: Medications  vancomycin (VANCOREADY) IVPB 1500 mg/300 mL (1,500 mg Intravenous New Bag/Given 10/29/22 0008)  aspirin chewable tablet 324 mg (324 mg Oral Given 10/28/22 2157)  lactated ringers bolus 1,000 mL (0 mLs Intravenous Stopped 10/29/22 0004)    And  lactated ringers bolus 1,000 mL (1,000 mLs Intravenous New Bag/Given 10/28/22 2319)  ceFEPIme (MAXIPIME) 2 g in sodium chloride 0.9 % 100 mL IVPB (0 g Intravenous Stopped 10/29/22 0004)     IMPRESSION / MDM / [place] / ED COURSE  I reviewed the triage vital signs and the nursing notes.  81 year old male with PMH as noted above presents as a code stroke activation due to apparent aphasia which seems to be resolving now.  The patient denies acute complaints.  On exam the vital signs are normal except for borderline tachycardia and borderline elevated temperature.  Neurologic exam is nonfocal.  The patient does appear mildly confused and hesitant to follow some commands, but does not appear to have actual aphasia or dysarthria.  Overall he appears possibly slightly diffusely altered.  Differential diagnosis includes, but is not limited to, TIA, CVA, complex migraine, seizure, near syncope, electrolyte abnormality, other metabolic disturbance, possible cardiac cause.  CT head is negative for acute findings.  I consulted and discussed the case with Dr. Samuel Germany the teleneurologist who evaluated the patient.  He does not recommend TNK or other acute intervention.  He recommends aspirin, MRI, and admission for further  work-up.  Patient's presentation is most consistent with acute presentation with potential threat to life or bodily function.  The patient is on the cardiac monitor to evaluate for evidence of arrhythmia and/or significant heart rate changes.  ----------------------------------------- 12:10 AM on 10/29/2022 -----------------------------------------  Lab work-up is significant for elevated lactate.  I have ordered fluids and empiric antibiotics for sepsis.  Urinalysis is still pending.  Labs are otherwise reassuring with negative troponin, no leukocytosis, and normal electrolytes.  MRI is negative for acute findings.  The patient's family is now here and confirms that he has returned to his baseline.  Overall I suspect possible TIA versus seizure versus altered mental status due to sepsis or other etiology.  He will be admitted for further work-up and treatment.  I  consulted Dr. Emmit Pomfret from the hospitalist service; based on her discussion she agrees to admit the patient.    FINAL CLINICAL IMPRESSION(S) / ED DIAGNOSES   Final diagnoses:  Altered awareness, transient  Elevated lactic acid level     Rx / DC Orders   ED Discharge Orders     None        Note:  This document was prepared using Dragon voice recognition software and may include unintentional dictation errors.    Dionne Bucy, MD 10/29/22 [phone]

## 2022-10-28 NOTE — H&P (Signed)
History and Physical   [facility] - [place] @ [facility] Admission History and Physical [employer], D.O.    Patient Name: Clayton Parker MR#: [id number] Date of Birth: [birth year] Date of Admission: 10/28/2022  Referring MD/NP/PA: Dr. Marisa Severin Primary Care Physician: Leim Fabry, MD  Chief Complaint:  Chief Complaint  Patient presents with   Code Stroke    HPI: Clayton Parker is a 81 y.o. male with a known history of HTN, HLD, CAD s/p MI, diabetes, CKD presents to the emergency department for evaluation of altered mental status.  Patient was in a usual state of health until 7:30 PM on the day of arrival when his wife noticed that he was responding as usual..  Patient denies fevers/chills, weakness, dizziness, chest pain, shortness of breath, N/V/C/D, abdominal pain, dysuria/frequency, changes in mental status.    Otherwise there has been no change in status. Patient has been taking medication as prescribed and there has been no recent change in medication or diet.  No recent antibiotics.  There has been no recent illness, hospitalizations, travel or sick contacts.    EMS/ED Course: Patient received Maxipime, vancomycin, aspirin, lactated Ringer's. Medical admission has been requested for further management of altered mental status, sepsis unclear source.  Review of Systems:  CONSTITUTIONAL: No fever/chills, fatigue, weakness, weight gain/loss, headache. EYES: No blurry or double vision. ENT: No tinnitus, postnasal drip, redness or soreness of the oropharynx. RESPIRATORY: No cough, dyspnea, wheeze.  No hemoptysis.  CARDIOVASCULAR: No chest pain, palpitations, syncope, orthopnea. No lower extremity edema.  GASTROINTESTINAL: No nausea, vomiting, abdominal pain, diarrhea, constipation.  No hematemesis, melena or hematochezia. GENITOURINARY: No dysuria, frequency, hematuria. ENDOCRINE: No polyuria or nocturia. No heat or cold intolerance. HEMATOLOGY: No anemia,  bruising, bleeding. INTEGUMENTARY: No rashes, ulcers, lesions. MUSCULOSKELETAL: No arthritis, gout. NEUROLOGIC: Positive aphasia no numbness, tingling, ataxia, seizure-type activity, weakness. PSYCHIATRIC: No anxiety, depression, insomnia.   Past Medical History:  Diagnosis Date   Arthritis    Chronic kidney disease    Coronary artery disease    stent x 1   Diabetes mellitus without complication (HCC)    Family history of adverse reaction to anesthesia    Daughter - PONV   GERD (gastroesophageal reflux disease)    Hyperlipidemia    Hypertension     Past Surgical History:  Procedure Laterality Date   cardaic stent     2004   CATARACT EXTRACTION W/PHACO Left 12/03/2017   Procedure: CATARACT EXTRACTION PHACO AND INTRAOCULAR LENS PLACEMENT (IOC) LEFT DIABETIC;  Surgeon: Nevada Crane, MD;  Location: [facility];  Service: Ophthalmology;  Laterality: Left;  Diabetic - oral meds   CATARACT EXTRACTION W/PHACO Right 02/18/2018   Procedure: CATARACT EXTRACTION PHACO AND INTRAOCULAR LENS PLACEMENT (IOC) RIGHT DIABETIC;  Surgeon: Nevada Crane, MD;  Location[facility];  Service: Ophthalmology;  Laterality: Right;  Diabetic - oral meds   COLONOSCOPY     CORONARY ANGIOPLASTY     HERNIA REPAIR     TOTAL HIP ARTHROPLASTY Right 04/02/2022   Procedure: TOTAL HIP ARTHROPLASTY;  Surgeon: Donato Heinz, MD;  Location: [facility];  Service: Orthopedics;  Laterality: Right;     reports that he has never smoked. He has never used smokeless tobacco. He reports that he does not drink alcohol and does not use drugs.  No Known Allergies  History reviewed. No pertinent family history.  Prior to Admission medications   Medication Sig Start Date End Date Taking? Authorizing Provider  aspirin EC 81 MG  tablet Take 81 mg by mouth daily. Swallow whole.   Yes [provider]  atorvastatin (LIPITOR) 80 MG tablet Take 80 mg by mouth daily.   Yes [provider]   Calcium Carb-Ergocalciferol (CHEWABLE CALCIUM/D PO) Take 2 tablets by mouth daily.   Yes [provider]  cetirizine (ZYRTEC) 10 MG tablet Take 10 mg by mouth daily.   Yes [provider]  Coenzyme Q10 (COQ-10) 100 MG CAPS Take 100 mg by mouth daily.   Yes [provider]  ferrous sulfate 325 (65 FE) MG EC tablet Take 325 mg by mouth daily with breakfast. 06/18/22 06/18/23 Yes [provider]  fluticasone (FLONASE) 50 MCG/ACT nasal spray Place 2 sprays into both nostrils daily.   Yes [provider]  ipratropium (ATROVENT) 0.06 % nasal spray Place 1 spray into the nose 2 (two) times daily. 10/22/22 10/22/23 Yes [provider]  metFORMIN (GLUCOPHAGE) 500 MG tablet Take 500-1,000 mg by mouth 2 (two) times daily. 2 tabs qam  1 tab qpm   Yes [provider]  metoprolol succinate (TOPROL-XL) 25 MG 24 hr tablet Take 1 tablet by mouth daily. 06/18/22 06/18/23 Yes [provider]  Misc Natural Products (OSTEO BI-FLEX TRIPLE STRENGTH PO) Take 2 tablets by mouth daily.   Yes [provider]  Multiple Vitamin (MULTIVITAMIN) tablet Take 2 tablets by mouth daily.   Yes [provider]  omeprazole (PRILOSEC OTC) 20 MG tablet Take 20 mg by mouth 3 (three) times a week.   Yes [provider]  sildenafil (REVATIO) 20 MG tablet Take 20 mg by mouth as needed.   Yes [provider]  vitamin B-12 (CYANOCOBALAMIN) 1000 MCG tablet Take 1,000 mcg by mouth 3 (three) times a week.   Yes [provider]  lisinopril (ZESTRIL) 10 MG tablet Take 1 tablet by mouth daily. Patient not taking: Reported on 10/28/2022 10/24/22 10/24/23  [provider]    Physical Exam: Vitals:   10/28/22 2139 10/28/22 2200 10/28/22 2300 10/28/22 2323  BP: (!) 151/65 (!) 154/70  138/80  Pulse: (!) 102 100 87 94  Resp: 16 (!) 33 18 17  Temp: 99.1 F (37.3 C)     TempSrc: Oral     SpO2: 99% 98% 98% 99%    GENERAL: 81  y.o.-year-old white male patient, well-developed, well-nourished lying in the bed in no acute distress.  Pleasant and cooperative.   HEENT: Head atraumatic, normocephalic. Pupils equal. Mucus membranes moist. NECK: Supple. No JVD. CHEST: Normal breath sounds bilaterally. No wheezing, rales, rhonchi or crackles. No use of accessory muscles of respiration.  No reproducible chest wall tenderness.  CARDIOVASCULAR: S1, S2 normal. No murmurs, rubs, or gallops. Cap refill <2 seconds. Pulses intact distally.  ABDOMEN: Soft, nondistended, nontender. No rebound, guarding, rigidity. Normoactive bowel sounds present in all four quadrants.  EXTREMITIES: No pedal edema, cyanosis, or clubbing. No calf tenderness or Homan's sign.  NEUROLOGIC: The patient is alert and oriented x 3. Cranial nerves II through XII are grossly intact with no focal sensorimotor deficit. PSYCHIATRIC:  Normal affect, mood, thought content. SKIN: Warm, dry, and intact without obvious rash, lesion, or ulcer.    Labs on Admission:  CBC: Recent Labs  Lab 10/28/22 2107  WBC 10.1  NEUTROABS 6.3  HGB 12.5*  HCT 38.9*  MCV 90.5  PLT 262   Basic Metabolic Panel: Recent Labs  Lab 10/28/22 2107  NA 142  K 4.3  CL 108  CO2 24  GLUCOSE 112*  BUN 33*  CREATININE 1.03  CALCIUM 9.4   GFR: CrCl cannot be calculated (Unknown ideal weight.). Liver Function Tests: Recent Labs  Lab 10/28/22 2107  AST 34  ALT 27  ALKPHOS 87  BILITOT 0.8  PROT 7.0  ALBUMIN 3.9   No results for input(s): "LIPASE", "AMYLASE" in the last 168 hours. No results for input(s): "AMMONIA" in the last 168 hours. Coagulation Profile: Recent Labs  Lab 10/28/22 2107  INR 1.0   Cardiac Enzymes: No results for input(s): "CKTOTAL", "CKMB", "CKMBINDEX", "TROPONINI" in the last 168 hours. BNP (last 3 results) No results for input(s): "PROBNP" in the last 8760 hours. HbA1C: No results for input(s): "HGBA1C" in the last 72 hours. CBG: Recent Labs   Lab 10/28/22 2104  GLUCAP 102*   Lipid Profile: No results for input(s): "CHOL", "HDL", "LDLCALC", "TRIG", "CHOLHDL", "LDLDIRECT" in the last 72 hours. Thyroid Function Tests: No results for input(s): "TSH", "T4TOTAL", "FREET4", "T3FREE", "THYROIDAB" in the last 72 hours. Anemia Panel: No results for input(s): "VITAMINB12", "FOLATE", "FERRITIN", "TIBC", "IRON", "RETICCTPCT" in the last 72 hours. Urine analysis:    Component Value Date/Time   COLORURINE YELLOW (A) 03/21/2022 1224   APPEARANCEUR CLEAR (A) 03/21/2022 1224   LABSPEC 1.019 03/21/2022 1224   PHURINE 5.0 03/21/2022 1224   GLUCOSEU 50 (A) 03/21/2022 1224   HGBUR NEGATIVE 03/21/2022 1224   BILIRUBINUR NEGATIVE 03/21/2022 1224   KETONESUR NEGATIVE 03/21/2022 1224   PROTEINUR NEGATIVE 03/21/2022 1224   NITRITE NEGATIVE 03/21/2022 1224   LEUKOCYTESUR NEGATIVE 03/21/2022 1224   Sepsis Labs: @LABRCNTIP (procalcitonin:4,lacticidven:4) )No results found for this or any previous visit (from the past 240 hour(s)).   Radiological Exams on Admission: MR BRAIN WO CONTRAST  Result Date: 10/28/2022 CLINICAL DATA:  Initial evaluation for neuro deficit, stroke suspected. EXAM: MRI HEAD WITHOUT CONTRAST TECHNIQUE: Multiplanar, multiecho pulse sequences of the brain and surrounding structures were obtained without intravenous contrast. COMPARISON:  Prior head CT from earlier the same day. FINDINGS: Brain: Generalized age-related cerebral atrophy. Patchy and confluent T2/FLAIR hyperintensity involving the periventricular deep white matter both cerebral hemispheres as well as the pons, most consistent with chronic small vessel ischemic disease, mild to moderate in nature. Few superimposed remote lacunar infarcts present about the hemispheric cerebral white matter and deep gray nuclei. No evidence for acute or subacute infarct. Gray-white matter differentiation maintained. No areas of chronic cortical infarction. No acute intracranial hemorrhage.  Single punctate chronic microhemorrhage noted at the left periatrial white matter, likely small vessel related. No mass lesion, midline shift or mass effect. No hydrocephalus or extra-axial fluid collection. Pituitary gland and suprasellar region within normal limits. Vascular: Major intracranial vascular flow voids are maintained. Skull and upper cervical spine: Craniocervical junction normal. Bone marrow signal intensity within normal limits. No scalp soft tissue abnormality. Sinuses/Orbits: Prior bilateral ocular lens replacement. Chronic right maxillary sinusitis noted. Mastoid air cells are clear. Other: None. IMPRESSION: 1. No acute intracranial abnormality. 2. Age-related cerebral atrophy with mild to moderate chronic microvascular ischemic disease. 3. Chronic right maxillary sinusitis. Electronically Signed   By: Jeannine Boga M.D.   On: 10/28/2022 22:55   DG Chest Port 1 View  Result Date: 10/28/2022 CLINICAL DATA:  Sepsis EXAM: PORTABLE CHEST 1 VIEW COMPARISON:  Radiographs 03/21/2018 FINDINGS: Mild cardiomegaly. Coronary stenting. Aortic atherosclerotic calcification. No focal consolidation, pleural effusion, or pneumothorax. Mild emphysema and bronchitic changes. No acute osseous abnormality. IMPRESSION: No focal pneumonia. Electronically Signed   By: Placido Sou M.D.   On: 10/28/2022 22:47  CT HEAD CODE STROKE WO CONTRAST  Result Date: 10/28/2022 CLINICAL DATA:  Code stroke. Initial evaluation for neuro deficit, stroke. EXAM: CT HEAD WITHOUT CONTRAST TECHNIQUE: Contiguous axial images were obtained from the base of the skull through the vertex without intravenous contrast. RADIATION DOSE REDUCTION: This exam was performed according to the departmental dose-optimization program which includes automated exposure control, adjustment of the mA and/or kV according to patient size and/or use of iterative reconstruction technique. COMPARISON:  Prior CT from 10/25/2016. FINDINGS: Brain:  Atrophy with chronic small vessel ischemic disease. No acute intracranial hemorrhage. No acute large vessel territory infarct. No mass lesion, midline shift or mass effect. Ventricular prominence most likely due to global parenchymal volume loss. No hydrocephalus. No extra-axial fluid collection. Vascular: No abnormal hyperdense vessel. Scattered vascular calcifications noted within the carotid siphons. Skull: Scalp soft tissues and calvarium within normal limits. Sinuses/Orbits: Globes orbital soft tissues demonstrate no acute finding. Chronic right maxillary sinusitis noted. No mastoid effusion. Other: None. ASPECTS [facility] Stroke Program Early CT Score) - Ganglionic level infarction (caudate, lentiform nuclei, internal capsule, insula, M1-M3 cortex): 7 - Supraganglionic infarction (M4-M6 cortex): 3 Total score (0-10 with 10 being normal): 10 IMPRESSION: 1. No acute intracranial abnormality. 2. ASPECTS is 10. 3. Atrophy with moderately advanced chronic microvascular ischemic disease. 4. Chronic right maxillary sinusitis. Results were called by telephone at the time of interpretation on 10/28/2022 at 9:28 pm to provider MARK QUALE , who verbally acknowledged these results. Electronically Signed   By: Rise Mu M.D.   On: 10/28/2022 21:31    EKG: Sinus tachycardia at 104 bpm with normal axis and nonspecific ST-T wave changes.   Assessment/Plan  This is a 81 y.o. male with a history of HTN, HLD, CAD s/p MI, diabetes, CKD  now being admitted with:  #.  Altered mental status initially called as a code stroke TIA rule out CVA -  - Admit telemetry observation for neuro workup including: - Studies: MRI, Echo, Carotids - Labs: CBC, BMP, Lipids, TFTs, A1C - Nursing: Neurochecks, O2, dysphagia screen, permissive hypertension.  - Consults: Neurology, PT/OT, S/S consults.  - Meds: Daily aspirin 81mg .   - Fluids: IVNS@75cc /hr.   - Routine DVT Px: with Lovenox, SCDs, early ambulation   #. Sepsis  secondary to unclear source - IV antibiotics: Continue Maxipime Vanco - IV fluid hydration - Follow up blood,urine & sputum cultures - Repeat CBC in am.  - Infectious disease consultation has been requested   #.  History of hypertension -Continue metoprolol lisinopril  #. History of hyperlipidemia - Continue Lipitor  #. History of diabetes -Accu-Cheks with insulin sliding scale coverage  Admission status: Progressive stroke IV Fluids: Normal saline Diet/Nutrition: N.p.o. pending speech Consults called: Telemetry neurology DVT Px: Lovenox, SCDs and early ambulation. Code Status: Full Code  Disposition Plan: To home in 1-2 days  All the records are reviewed and case discussed with ED provider. Management plans discussed with the patient and/or family who express understanding and agree with plan of care.  Clayton Parker D.O. on 10/28/2022 at 11:43 PM CC: Primary care physician; 13/04/2022, MD   10/28/2022, 11:43 PM

## 2022-10-29 ENCOUNTER — Observation Stay: Payer: Medicare Other

## 2022-10-29 ENCOUNTER — Other Ambulatory Visit: Payer: Self-pay

## 2022-10-29 DIAGNOSIS — R404 Transient alteration of awareness: Secondary | ICD-10-CM | POA: Diagnosis not present

## 2022-10-29 DIAGNOSIS — W19XXXA Unspecified fall, initial encounter: Secondary | ICD-10-CM

## 2022-10-29 DIAGNOSIS — R4182 Altered mental status, unspecified: Secondary | ICD-10-CM | POA: Diagnosis present

## 2022-10-29 DIAGNOSIS — R6510 Systemic inflammatory response syndrome (SIRS) of non-infectious origin without acute organ dysfunction: Secondary | ICD-10-CM

## 2022-10-29 LAB — CBC
HCT: 33.6 % — ABNORMAL LOW (ref 39.0–52.0)
Hemoglobin: 11 g/dL — ABNORMAL LOW (ref 13.0–17.0)
MCH: 29.6 pg (ref 26.0–34.0)
MCHC: 32.7 g/dL (ref 30.0–36.0)
MCV: 90.6 fL (ref 80.0–100.0)
Platelets: 214 10*3/uL (ref 150–400)
RBC: 3.71 MIL/uL — ABNORMAL LOW (ref 4.22–5.81)
RDW: 14.1 % (ref 11.5–15.5)
WBC: 10 10*3/uL (ref 4.0–10.5)
nRBC: 0 % (ref 0.0–0.2)

## 2022-10-29 LAB — COMPREHENSIVE METABOLIC PANEL
ALT: 26 U/L (ref 0–44)
AST: 28 U/L (ref 15–41)
Albumin: 3.2 g/dL — ABNORMAL LOW (ref 3.5–5.0)
Alkaline Phosphatase: 80 U/L (ref 38–126)
Anion gap: 5 (ref 5–15)
BUN: 28 mg/dL — ABNORMAL HIGH (ref 8–23)
CO2: 26 mmol/L (ref 22–32)
Calcium: 9.1 mg/dL (ref 8.9–10.3)
Chloride: 109 mmol/L (ref 98–111)
Creatinine, Ser: 0.9 mg/dL (ref 0.61–1.24)
GFR, Estimated: >60 mL/min (ref >60)
Glucose, Bld: 109 mg/dL — ABNORMAL HIGH (ref 70–99)
Potassium: 4.3 mmol/L (ref 3.5–5.1)
Sodium: 140 mmol/L (ref 135–145)
Total Bilirubin: 0.9 mg/dL (ref 0.3–1.2)
Total Protein: 5.8 g/dL — ABNORMAL LOW (ref 6.5–8.1)

## 2022-10-29 LAB — URINALYSIS, ROUTINE W REFLEX MICROSCOPIC
Bacteria, UA: NONE SEEN
Bilirubin Urine: NEGATIVE
Glucose, UA: NEGATIVE mg/dL
Hgb urine dipstick: NEGATIVE
Ketones, ur: NEGATIVE mg/dL
Nitrite: NEGATIVE
Protein, ur: NEGATIVE mg/dL
Specific Gravity, Urine: 1.018 (ref 1.005–1.030)
Squamous Epithelial / HPF: NONE SEEN (ref 0–5)
pH: 5 (ref 5.0–8.0)

## 2022-10-29 LAB — LIPID PANEL
Cholesterol: 71 mg/dL (ref 0–200)
HDL: 25 mg/dL — ABNORMAL LOW (ref >40)
LDL Cholesterol: 32 mg/dL (ref 0–99)
Total CHOL/HDL Ratio: 2.8 RATIO
Triglycerides: 70 mg/dL (ref <150)
VLDL: 14 mg/dL (ref 0–40)

## 2022-10-29 LAB — GLUCOSE, CAPILLARY
Glucose-Capillary: 85 mg/dL (ref 70–99)
Glucose-Capillary: 86 mg/dL (ref 70–99)
Glucose-Capillary: 122 mg/dL — ABNORMAL HIGH (ref 70–99)
Glucose-Capillary: 124 mg/dL — ABNORMAL HIGH (ref 70–99)

## 2022-10-29 LAB — HEMOGLOBIN A1C
Hgb A1c MFr Bld: 6.5 % — ABNORMAL HIGH (ref 4.8–5.6)
Mean Plasma Glucose: 139.85 mg/dL

## 2022-10-29 LAB — PROCALCITONIN: Procalcitonin: <0.1 ng/mL

## 2022-10-29 LAB — LACTIC ACID, PLASMA: Lactic Acid, Venous: 1.1 mmol/L (ref 0.5–1.9)

## 2022-10-29 LAB — TSH: TSH: 1.946 u[IU]/mL (ref 0.350–4.500)

## 2022-10-29 MED ORDER — ONDANSETRON HCL 4 MG/2ML IJ SOLN: 4.0000 mg | Freq: Four times a day (QID) | INTRAMUSCULAR | Status: DC | PRN

## 2022-10-29 MED ORDER — GADOBUTROL 1 MMOL/ML IV SOLN
6.0000 mL | Freq: Once | INTRAVENOUS | Status: AC | PRN
  Administered 2022-10-29: 6 mL via INTRAVENOUS

## 2022-10-29 MED ORDER — ONDANSETRON HCL 4 MG PO TABS: 4.0000 mg | ORAL_TABLET | Freq: Four times a day (QID) | ORAL | Status: DC | PRN

## 2022-10-29 MED ORDER — METOPROLOL SUCCINATE ER 25 MG PO TB24
25.0000 mg | ORAL_TABLET | Freq: Every day | ORAL | Status: DC
  Administered 2022-10-29 – 2022-10-30 (×2): 25 mg via ORAL
  Filled 2022-10-29 (×2): qty 1

## 2022-10-29 MED ORDER — SODIUM CHLORIDE 0.9 % IV SOLN: INTRAVENOUS | Status: DC

## 2022-10-29 MED ORDER — PANTOPRAZOLE SODIUM 40 MG IV SOLR
40.0000 mg | INTRAVENOUS | Status: DC
  Administered 2022-10-29: 40 mg via INTRAVENOUS
  Filled 2022-10-29: qty 10

## 2022-10-29 MED ORDER — HYDRALAZINE HCL 20 MG/ML IJ SOLN: 5.0000 mg | Freq: Four times a day (QID) | INTRAMUSCULAR | Status: DC | PRN

## 2022-10-29 MED ORDER — BISACODYL 5 MG PO TBEC: 5.0000 mg | DELAYED_RELEASE_TABLET | Freq: Every day | ORAL | Status: DC | PRN

## 2022-10-29 MED ORDER — VANCOMYCIN HCL IN DEXTROSE 1-5 GM/200ML-% IV SOLN: 1000.0000 mg | INTRAVENOUS | Status: DC

## 2022-10-29 MED ORDER — FERROUS SULFATE 325 (65 FE) MG PO TABS
325.0000 mg | ORAL_TABLET | Freq: Every day | ORAL | Status: DC
  Administered 2022-10-29 – 2022-10-30 (×2): 325 mg via ORAL
  Filled 2022-10-29 (×2): qty 1

## 2022-10-29 MED ORDER — ACETAMINOPHEN 325 MG PO TABS: 650.0000 mg | ORAL_TABLET | Freq: Four times a day (QID) | ORAL | Status: DC | PRN

## 2022-10-29 MED ORDER — INSULIN ASPART 100 UNIT/ML IJ SOLN
0.0000 [IU] | INTRAMUSCULAR | Status: DC
  Administered 2022-10-30: 2 [IU] via SUBCUTANEOUS
  Filled 2022-10-29 (×2): qty 1

## 2022-10-29 MED ORDER — HYDROCODONE-ACETAMINOPHEN 5-325 MG PO TABS: 1.0000 | ORAL_TABLET | ORAL | Status: DC | PRN

## 2022-10-29 MED ORDER — IOHEXOL 350 MG/ML SOLN
75.0000 mL | Freq: Once | INTRAVENOUS | Status: AC | PRN
  Administered 2022-10-29: 75 mL via INTRAVENOUS

## 2022-10-29 MED ORDER — ASPIRIN 81 MG PO TBEC
81.0000 mg | DELAYED_RELEASE_TABLET | Freq: Every day | ORAL | Status: DC
  Administered 2022-10-29 – 2022-10-30 (×2): 81 mg via ORAL
  Filled 2022-10-29 (×2): qty 1

## 2022-10-29 MED ORDER — ACETAMINOPHEN 650 MG RE SUPP: 650.0000 mg | Freq: Four times a day (QID) | RECTAL | Status: DC | PRN

## 2022-10-29 MED ORDER — SENNOSIDES-DOCUSATE SODIUM 8.6-50 MG PO TABS: 1.0000 | ORAL_TABLET | Freq: Every evening | ORAL | Status: DC | PRN

## 2022-10-29 MED ORDER — FLUTICASONE PROPIONATE 50 MCG/ACT NA SUSP
1.0000 | Freq: Every day | NASAL | Status: DC
  Administered 2022-10-30: 1 via NASAL
  Filled 2022-10-29 (×2): qty 16

## 2022-10-29 MED ORDER — SODIUM CHLORIDE 0.9% FLUSH
3.0000 mL | Freq: Two times a day (BID) | INTRAVENOUS | Status: DC
  Administered 2022-10-29 – 2022-10-30 (×3): 3 mL via INTRAVENOUS

## 2022-10-29 MED ORDER — MORPHINE SULFATE (PF) 2 MG/ML IV SOLN: 1.0000 mg | Freq: Four times a day (QID) | INTRAVENOUS | Status: DC | PRN

## 2022-10-29 MED ORDER — PANTOPRAZOLE SODIUM 20 MG PO TBEC: 20.0000 mg | DELAYED_RELEASE_TABLET | ORAL | Status: DC

## 2022-10-29 MED ORDER — SODIUM CHLORIDE 0.9 % IV SOLN
2.0000 g | Freq: Two times a day (BID) | INTRAVENOUS | Status: DC
  Administered 2022-10-29: 2 g via INTRAVENOUS
  Filled 2022-10-29 (×2): qty 12.5

## 2022-10-29 MED ORDER — LORATADINE 10 MG PO TABS
10.0000 mg | ORAL_TABLET | Freq: Every day | ORAL | Status: DC
  Administered 2022-10-30: 10 mg via ORAL
  Filled 2022-10-29: qty 1

## 2022-10-29 MED ORDER — SODIUM CHLORIDE 0.9 % IV SOLN
2.0000 g | INTRAVENOUS | Status: DC
  Administered 2022-10-30: 2 g via INTRAVENOUS
  Filled 2022-10-29: qty 2

## 2022-10-29 MED ORDER — OMEPRAZOLE MAGNESIUM 20 MG PO TBEC: 20.0000 mg | DELAYED_RELEASE_TABLET | ORAL | Status: DC

## 2022-10-29 MED ORDER — ENOXAPARIN SODIUM 40 MG/0.4ML IJ SOSY
40.0000 mg | PREFILLED_SYRINGE | INTRAMUSCULAR | Status: DC
  Administered 2022-10-29 – 2022-10-30 (×2): 40 mg via SUBCUTANEOUS
  Filled 2022-10-29 (×2): qty 0.4

## 2022-10-29 MED ORDER — ATORVASTATIN CALCIUM 20 MG PO TABS
80.0000 mg | ORAL_TABLET | Freq: Every day | ORAL | Status: DC
  Administered 2022-10-29 – 2022-10-30 (×2): 80 mg via ORAL
  Filled 2022-10-29 (×2): qty 4

## 2022-10-29 NOTE — Evaluation (Signed)
Speech Language Pathology Evaluation Patient Details Name: Clayton Parker MRN: [id number] DOB: October 15, 1941 Today's Date: 10/29/2022 Time: 0272-5366 SLP Time Calculation (min) (ACUTE ONLY): 25 min  Problem List:  Patient Active Problem List   Diagnosis Date Noted   AMS (altered mental status) 10/29/2022   SIRS (systemic inflammatory response syndrome) ([place]) 10/29/2022   Fall at home, initial encounter 10/29/2022   Altered mental status 10/28/2022   Altered awareness, transient 10/28/2022   Elevated lactic acid level 10/28/2022   Hx of total hip arthroplasty, right 04/02/2022   CAD (coronary artery disease) 04/01/2022   ED (erectile dysfunction) 04/01/2022   GERD (gastroesophageal reflux disease) 04/01/2022   Hx of skin cancer, basal cell 04/01/2022   Type 2 diabetes mellitus with chronic kidney disease, without long-term current use of insulin ([place]) 04/01/2022   Primary osteoarthritis of right hip 01/07/2022   Mild anemia 10/16/2021   Generalized OA 10/10/2020   Family history of skin cancer 05/23/2016   Essential hypertension 11/16/2014   Pure hypercholesterolemia 11/16/2014   Past Medical History:  Past Medical History:  Diagnosis Date   Arthritis    Chronic kidney disease    Coronary artery disease    stent x 1   Diabetes mellitus without complication ([place])    Family history of adverse reaction to anesthesia    Daughter - PONV   GERD (gastroesophageal reflux disease)    Hyperlipidemia    Hypertension    Past Surgical History:  Past Surgical History:  Procedure Laterality Date   cardaic stent     2004   CATARACT EXTRACTION W/PHACO Left 12/03/2017   Procedure: CATARACT EXTRACTION PHACO AND INTRAOCULAR LENS PLACEMENT ([place]) LEFT DIABETIC;  Surgeon: Eulogio Bear, MD;  Location: [place];  Service: Ophthalmology;  Laterality: Left;  Diabetic - oral meds   CATARACT EXTRACTION W/PHACO Right 02/18/2018   Procedure: CATARACT EXTRACTION PHACO AND  INTRAOCULAR LENS PLACEMENT ([place]) RIGHT DIABETIC;  Surgeon: Eulogio Bear, MD;  Location: [place];  Service: Ophthalmology;  Laterality: Right;  Diabetic - oral meds   COLONOSCOPY     CORONARY ANGIOPLASTY     HERNIA REPAIR     TOTAL HIP ARTHROPLASTY Right 04/02/2022   Procedure: TOTAL HIP ARTHROPLASTY;  Surgeon: Dereck Leep, MD;  Location: [facility];  Service: Orthopedics;  Laterality: Right;   HPI:  Clayton Parker is a 81 y.o. male with a known history of HTN, HLD, CAD s/p MI, diabetes, CKD presents to the emergency department for evaluation of altered mental status.  Patient was in a usual state of health until 7:30 PM on the day of arrival when his wife noticed that he was responding as usual, states that his speech was sluggish, he was slow to respond as if he were staring off into space.  She feels he is mostly back to baseline but still slightly slow to respond. Presenting as code stroke. 10/28/22: CXR "No focal pneumonia." MRI 10/29/22: "1. No acute intracranial abnormality. Age-related cerebral atrophy with mild to moderate chronic microvascular ischemic disease. Chronic right maxillary sinusitis." Care everywhere revealing further detail for cognition. Per 10/22/22 well [facility] at [facility]- The patient does have evidence of cognitive or memory problems. This is a new concern. Has noticed some memory changes since being in the hospital, provider aware Patient's wife and patient have noticed some memory changes and forgetfulness since all hospital visits this year.   Assessment / Plan / Recommendation Clinical Impression  Pt presents with suspected sub acute (potentially chronic)  cognitive communication deficits, starting approximately 4 weeks ago per spouse. Pt's spouse reporting changes in speech (reporting increased thickness in pronunciation, slowed rate, and pausing) and processing speed. Additionally, pt has been having staring spells where he seems to disengage from surroundings  and conversation. Today, cognitive assessment included portions of the Mini Mental State examination pt/spouse interview. Pt demonstrating mildly increased processing time and thought disorganization. Additionally speech intelligibility is notable for mildly reduced articulation precision. Short term memory impaired for determination of timeline for last two days. Pt with immerging insight for changes, reporting increased challenge for paying bills (a task he used to complete independently). Expressive/receptive language, orientation, and sustained attention appear grossly intact. Based on identified deficits and spouse report of evolving changes to cognition, recommend assessment via neurology/neuropsychology. SLP intervention potentially warranted following neuro work-up.    SLP Assessment  SLP Recommendation/Assessment: All further Speech Lanaguage Pathology  needs can be addressed in the next venue of care SLP Visit Diagnosis: Cognitive communication deficit (R41.841);Dysarthria and anarthria (R47.1)    Recommendations for follow up therapy are one component of a multi-disciplinary discharge planning process, led by the attending physician.  Recommendations may be updated based on patient status, additional functional criteria and insurance authorization.    Follow Up Recommendations  Follow physician's recommendations for discharge plan and follow up therapies    Assistance Recommended at Discharge  Intermittent Supervision/Assistance  Functional Status Assessment Patient has not had a recent decline in their functional status  Frequency and Duration           SLP Evaluation Cognition  Overall Cognitive Status: History of cognitive impairments - at baseline Arousal/Alertness: Awake/alert Orientation Level: Oriented X4 (with verbal cues for day of the week) Day of Week: Incorrect Attention: Sustained Sustained Attention: Appears intact Memory: Impaired Memory Impairment: Decreased  short term memory;Decreased recall of new information Decreased Short Term Memory: Verbal basic Awareness: Appears intact (immerging) Problem Solving: Impaired Problem Solving Impairment: Verbal complex Executive Function: Organizing;Reasoning Reasoning: Impaired Reasoning Impairment: Verbal complex Organizing: Impaired Organizing Impairment: Verbal complex Safety/Judgment: Impaired       Comprehension  Auditory Comprehension Overall Auditory Comprehension: Appears within functional limits for tasks assessed Visual Recognition/Discrimination Discrimination: Within Function Limits (for reading clock) Reading Comprehension Reading Status: Not tested    Expression Expression Primary Mode of Expression: Verbal Verbal Expression Overall Verbal Expression: Appears within functional limits for tasks assessed   Oral / Motor  Oral Motor/Sensory Function Overall Oral Motor/Sensory Function: Within functional limits Motor Speech Overall Motor Speech: Impaired at baseline Respiration: Within functional limits Phonation: Hoarse Resonance: Within functional limits Articulation: Impaired Level of Impairment: Conversation Intelligibility: Intelligibility reduced Sentence: 75-100% accurate Conversation: 75-100% accurate Motor Planning: Witnin functional limits Motor Speech Errors: Aware Interfering Components: Premorbid status;Hearing loss Effective Techniques: Increased vocal intensity;Slow rate   [place] Furious Chiarelli [place] MS [facility] SLP          [place] J Clapp 10/29/2022, 1:40 PM

## 2022-10-29 NOTE — Assessment & Plan Note (Signed)
Well-controlled with A1c of 6.5 -ssi

## 2022-10-29 NOTE — TOC Progression Note (Signed)
Transition of Care [facility]) - Progression Note    Patient Details  Name: Clayton Parker MRN: [id number] Date of Birth: 01-18-1941  Transition of Care [facility]) CM/SW [place], RN Phone Number: 10/29/2022, 4:35 PM  Clinical Narrative:     Met with the patient and family at the bedside, Reviewed the Code 53 He will need a Rolling walker, they are wanting him seen and evaluated by PT TOC to follow and assist with needs        Expected Discharge Plan and Services                                                 Social Determinants of Health (SDOH) Interventions    Readmission Risk Interventions     No data to display

## 2022-10-29 NOTE — Assessment & Plan Note (Signed)
Patient with frequent transient staring spells and progressively worsening weakness.  CT head and MRI brain was negative for any acute stroke. Telemetry neuro was consulted initially in the ED-no specific recommendations. -I did get benefit from EEG -Recommend patient appears to be at baseline now.

## 2022-10-29 NOTE — Progress Notes (Signed)
SLP Cancellation Note  Patient Details Name: Clayton Parker MRN: [id number] DOB: 1941-06-30   Cancelled treatment:       Reason Eval/Treat Not Completed: Other (comment) (Order received for Bedside Swallow Assessment. Pt passed [place]- pt/spouse denied concern for PO intake.)   [place] Neelie Welshans Clapp MS CCC SLP   [place] J Clapp 10/29/2022, 1:43 PM

## 2022-10-29 NOTE — Progress Notes (Signed)
Eeg done 

## 2022-10-29 NOTE — Progress Notes (Signed)
Progress Note   Patient: Clayton Parker:[id number] DOB: [birth year] DOA: 10/28/2022     1 DOS: the patient was seen and examined on 10/29/2022   Brief hospital course: Taken from H&P.  Clayton Parker is a 81 y.o. male with a known history of HTN, HLD, CAD s/p MI, diabetes, CKD presents to the emergency department for evaluation of altered mental status.  Patient was in a usual state of health until 7:30 PM on the day of arrival when his wife noticed that he was responding as usual, states that his speech was sluggish, he was slow to respond as if he were staring off into space.  She feels he is mostly back to baseline but still slightly slow to respond.     He occasionally feels unsteady on his feet and last week he fell once due to loss of balance.  He also reports that he has had a cold, nasal congestion and post-nasal drip for a month now.   He was diagnosed with a sinus infection and placed on amoxicillin last month.   ED course.  On arrival he was afebrile, mild tachycardia and tachypnea, labs pertinent for lactic acidosis at 3.8 which has been resolved now.  UA with few leukocytes. Troponin remain negative.  Cultures pending. Chest x-ray without any acute abnormality. CT head and MRI brain was negative for any acute abnormality or stroke. EKG. personally reviewed and only shows mild sinus tachycardia  11/5: Remained afebrile, labs mostly unremarkable, lactic acidosis has been resolved. Initially admitted as sepsis and started on broad-spectrum antibiotics.  Blood cultures pending. Urine cultures were not done, UA with few leukocytes, added as add-on to complete the sepsis work-up.  Procalcitonin negative, de-escalating antibiotics to ceftriaxone.  Patient with gradually declining since April after his hip replacement surgery.  Had 3 hospitalizations since then for respiratory concern, in June he was found to have pneumonia and treated with antibiotics.  In July he was found to have  large right pleural effusion, thoracentesis with transudative fluid, some concerns on flow cytometry so he was referred to oncology.  PET scan with some nonspecific abnormalities.  Per oncology note most likely parapneumonic effusion secondary to recent pneumonia they were recommending follow-up. Effusion seems to be resolved on current imaging.  PSA was also checked due to some concern of some activity on PET in his prostate and it was within normal limit. Patient continue to have progressive weakness with some history of unintentional weight loss. Had a recent fall.  Per wife she also noticed frequent staring spells, patient normally go back to his prior activity after that.  Wife does not sure about any persistent confusion after the spells.  Per wife his speech seems little more than normal. He was seen by telemetry neurology in ED with no specific recommendations. Might get benefit from getting an EEG-message sent to neurology.    Assessment and Plan: * Altered mental status Patient with frequent transient staring spells and progressively worsening weakness.  CT head and MRI brain was negative for any acute stroke. Telemetry neuro was consulted initially in the ED-no specific recommendations. -I did get benefit from EEG -Recommend patient appears to be at baseline now.  SIRS (systemic inflammatory response syndrome) (HCC) Code sepsis was called initially, patient did not completely meet sepsis criteria so sepsis ruled out.  Blood cultures are pending.  UA with only few leukocytes but urine cultures were never sent and he was started on broad-spectrum antibiotics. Elevated lactic acid patient responded  very well to fluid and normalized. Procalcitonin negative -Urine cultures as add-on to complete the work-up -De-escalate antibiotics to ceftriaxone for now as it is no other obvious infection, small concern of UTI.  Essential hypertension Blood pressure within goal. -Continue home  metoprolol  Type 2 diabetes mellitus with chronic kidney disease, without long-term current use of insulin (HCC) Well-controlled with A1c of 6.5 -ssi  Fall at home, initial encounter Patient with worsening weakness and some weight loss which has been worked up by oncology and no specific reason found so far. -PT/OT evaluation  CAD (coronary artery disease) S/p PCI. No chest pain and troponin remain negative. -Continue home aspirin and statin -Continue beta-blocker  GERD (gastroesophageal reflux disease) -Continue home dose of PPI   Subjective: Patient was seen and examined today.  He was very concerned about his declining  health since hip arthroplasty in April 2023.  Patient denies any pain. Having some postnasal drip for the past couple of week, no fever or chills.  Physical Exam: Vitals:   10/29/22 0730 10/29/22 0825 10/29/22 0910 10/29/22 1136  BP: (!) 103/53  127/65 (!) 112/59  Pulse:   80 64  Resp: 13 16 18 18   Temp:  98.2 F (36.8 C) 97.8 F (36.6 C) 98.4 F (36.9 C)  TempSrc:  Oral    SpO2:  96% 96% 98%  Height:       General.  Well-developed elderly man, in no acute distress. Pulmonary.  Lungs clear bilaterally, normal respiratory effort. CV.  Regular rate and rhythm, no JVD, rub or murmur. Abdomen.  Soft, nontender, nondistended, BS positive. CNS.  Alert and oriented .  No focal neurologic deficit. Extremities.  No edema, no cyanosis, pulses intact and symmetrical. Psychiatry.  Judgment and insight appears normal.   Data Reviewed: Prior data reviewed  Family Communication: Discussed with wife at bedside  Disposition: Status is: Inpatient Remains inpatient appropriate because: Severity of illness  Planned Discharge Destination: Home with Home Health  DVT prophylaxis.  Lovenox Time spent: 50 minutes  This record has been created using Systems analyst. Errors have been sought and corrected,but may not always be located. Such creation  errors do not reflect on the standard of care.   Author: Lorella Nimrod, MD 10/29/2022 1:18 PM  For on call review [email or web address].

## 2022-10-29 NOTE — Progress Notes (Signed)
Pharmacy Antibiotic Note  Clayton Parker is a 81 y.o. male admitted on 10/28/2022 with sepsis.  Pharmacy has been consulted for Cefepime & Vancomycin dosing.  Plan: Cefepime 2 gm q12hr per indication & renal fxn.  Pt given Vancomycin 1500 mg once. Vancomycin 1000 mg IV Q 24 hrs. Goal AUC 400-550. Expected AUC: 504.4 SCr used: 1.03  Pharmacy will continue to follow and will adjust abx dosing whenever warranted.  Temp (24hrs), Avg:99.1 F (37.3 C), Min:99.1 F (37.3 C), Max:99.1 F (37.3 C)   Recent Labs  Lab 10/28/22 2107 10/28/22 2118 10/28/22 2313  WBC 10.1  --   --   CREATININE 1.03  --   --   LATICACIDVEN  --  3.8* 2.8*    CrCl cannot be calculated (Unknown ideal weight.).    No Known Allergies  Antimicrobials this admission: 11/5 Vancomycin >>  11/5 Cefepime >>   Microbiology results: 11/5 BCx: Pending  Thank you for allowing pharmacy to be a part of this patient's care.  Renda Rolls, PharmD, [facility] 10/29/2022 12:09 AM

## 2022-10-29 NOTE — Assessment & Plan Note (Signed)
Patient with worsening weakness and some weight loss which has been worked up by oncology and no specific reason found so far. -PT/OT evaluation

## 2022-10-29 NOTE — Assessment & Plan Note (Signed)
-  Continue home dose of PPI

## 2022-10-29 NOTE — Care Management CC44 (Signed)
Condition Code 44 Documentation Completed  Patient Details  Name: Clayton Parker MRN: [id number] Date of Birth: 28-Jun-1941   Condition Code 44 given:  Yes Patient signature on Condition Code 44 notice:  Yes Documentation of 2 MD's agreement:  Yes Code 44 added to claim:  Yes    Conception Oms, RN 10/29/2022, 4:34 PM

## 2022-10-29 NOTE — Assessment & Plan Note (Signed)
Blood pressure within goal. -Continue home metoprolol

## 2022-10-29 NOTE — Hospital Course (Addendum)
Taken from H&P.  Clayton Parker is a 81 y.o. male with a known history of HTN, HLD, CAD s/p MI, diabetes, CKD presents to the emergency department for evaluation of altered mental status.  Patient was in a usual state of health until 7:30 PM on the day of arrival when his wife noticed that he was responding as usual, states that his speech was sluggish, he was slow to respond as if he were staring off into space.  She feels he is mostly back to baseline but still slightly slow to respond.     He occasionally feels unsteady on his feet and last week he fell once due to loss of balance.  He also reports that he has had a cold, nasal congestion and post-nasal drip for a month now.   He was diagnosed with a sinus infection and placed on amoxicillin last month.   ED course.  On arrival he was afebrile, mild tachycardia and tachypnea, labs pertinent for lactic acidosis at 3.8 which has been resolved now.  UA with few leukocytes. Troponin remain negative.  Cultures pending. Chest x-ray without any acute abnormality. CT head and MRI brain was negative for any acute abnormality or stroke. EKG. personally reviewed and only shows mild sinus tachycardia  11/5: Remained afebrile, labs mostly unremarkable, lactic acidosis has been resolved. Initially admitted as sepsis and started on broad-spectrum antibiotics.  Blood cultures pending. Urine cultures were not done, UA with few leukocytes, added as add-on to complete the sepsis work-up.  Procalcitonin negative, de-escalating antibiotics to ceftriaxone.  Patient with gradually declining since April after his hip replacement surgery.  Had 3 hospitalizations since then for respiratory concern, in June he was found to have pneumonia and treated with antibiotics.  In July he was found to have large right pleural effusion, thoracentesis with transudative fluid, some concerns on flow cytometry so he was referred to oncology.  PET scan with some nonspecific abnormalities.   Per oncology note most likely parapneumonic effusion secondary to recent pneumonia they were recommending follow-up. Effusion seems to be resolved on current imaging.  PSA was also checked due to some concern of some activity on PET in his prostate and it was within normal limit. Patient continue to have progressive weakness with some history of unintentional weight loss. Had a recent fall.  Per wife she also noticed frequent staring spells, patient normally go back to his prior activity after that.  Wife does not sure about any persistent confusion after the spells.  Per wife his speech seems little more than normal. He was seen by telemetry neurology in ED with no specific recommendations. Might get benefit from getting an EEG-message sent to neurology.  11/7: Procalcitonin remain negative.  Repeat MRI with contrast was negative for any acute intracranial processes.  CTA of the head and neck with with no acute intracranial process and no significant large vessel occlusion.  EEG normal.  Urine cultures negative. Echocardiogram with bubble study was without any significant abnormality.  Patient with slow declining and becoming slow, intermittent confusion noted by wife. Patient need to have an evaluation done by neurology as an outpatient.  Patient will continue with his current medications and follow-up with his providers for further recommendations.

## 2022-10-29 NOTE — Progress Notes (Signed)
PT Cancellation Note  Patient Details Name: Clayton Parker MRN: [id number] DOB: 11/30/1941   Cancelled Treatment:    Reason Eval/Treat Not Completed: Patient at procedure or test/unavailable, will attempt to see pt at a future date/time as medically appropriate.     Linus Salmons PT, DPT 10/29/22, 3:21 PM

## 2022-10-29 NOTE — Assessment & Plan Note (Signed)
S/p PCI. No chest pain and troponin remain negative. -Continue home aspirin and statin -Continue beta-blocker

## 2022-10-29 NOTE — Assessment & Plan Note (Signed)
Code sepsis was called initially, patient did not completely meet sepsis criteria so sepsis ruled out.  Blood cultures are pending.  UA with only few leukocytes but urine cultures were never sent and he was started on broad-spectrum antibiotics. Elevated lactic acid patient responded very well to fluid and normalized. Procalcitonin negative -Urine cultures as add-on to complete the work-up -De-escalate antibiotics to ceftriaxone for now as it is no other obvious infection, small concern of UTI.

## 2022-10-30 ENCOUNTER — Observation Stay
Admit: 2022-10-30 | Discharge: 2022-10-30 | Disposition: A | Discharge status: Home / Self Care | Payer: Medicare Other | Attending: Neurology | Admitting: Neurology

## 2022-10-30 DIAGNOSIS — R4701 Aphasia: Secondary | ICD-10-CM | POA: Diagnosis not present

## 2022-10-30 DIAGNOSIS — R404 Transient alteration of awareness: Secondary | ICD-10-CM | POA: Diagnosis not present

## 2022-10-30 LAB — ECHOCARDIOGRAM COMPLETE BUBBLE STUDY
AR max vel: 2.82 cm2
AV Area VTI: 2.66 cm2
AV Area mean vel: 2.81 cm2
AV Mean grad: 6 mmHg
AV Peak grad: 9.7 mmHg
Ao pk vel: 1.56 m/s
Area-P 1/2: 3.56 cm2
S' Lateral: 2.7 cm

## 2022-10-30 LAB — GLUCOSE, CAPILLARY
Glucose-Capillary: 116 mg/dL — ABNORMAL HIGH (ref 70–99)
Glucose-Capillary: 99 mg/dL (ref 70–99)
Glucose-Capillary: 93 mg/dL (ref 70–99)
Glucose-Capillary: 132 mg/dL — ABNORMAL HIGH (ref 70–99)

## 2022-10-30 LAB — URINE CULTURE: Culture: NO GROWTH

## 2022-10-30 LAB — PROCALCITONIN: Procalcitonin: <0.1 ng/mL

## 2022-10-30 NOTE — Plan of Care (Signed)
Patient discharged per MD orders at this time.All dc instructions, education and medications reviewed with the patient.Pt expressed understanding and will comply with dc instructions.follow up appointments was also communicated to the patient.no verbal c/o or any ssx of distress.Pt was discharged home with outpatient PT services per order.Pt was transported home by wife in a privately owned vehicle.

## 2022-10-30 NOTE — Evaluation (Signed)
Physical Therapy Evaluation Patient Details Name: Clayton Parker MRN: [id number] DOB: 08-23-1941 Today's Date: 10/30/2022  History of Present Illness  81 y.o. male with a known history of HTN, HLD, CAD s/p MI, diabetes, CKD presented to the emergency department for evaluation of altered mental status.  Wife noticed that he was not responding as usual, states that his speech was sluggish, he was slow to respond as if he were staring off into space.  She feels he is mostly back to baseline but still slightly slow to respond.  Pt has apparently had PNA/congestion for >1 month.  S/P R total hip replacement ~7 months ago.  Clinical Impression  Unsure of pt's baseline mentation but there was certainly some delayed response and mild situational confusion, though he was able to follow instructions well and perform PT requested tasks.  Pt was able to ambulate ~200 ft, mostly with walker but with ~25 ft w/o AD displaying slower and more guarded cadence.  He did not have any LOBs, pain or significant fatigue but remains generally weaker and more limited that his baseline had gotten to post PT.  Family reports pt has been good about doing his HEP from prior HHPT but concerned about his general weakness and unsteadiness, recommending outpt PT to further address balance, strength and activity tolerance deficits.     Recommendations for follow up therapy are one component of a multi-disciplinary discharge planning process, led by the attending physician.  Recommendations may be updated based on patient status, additional functional criteria and insurance authorization.  Follow Up Recommendations Outpatient PT      Assistance Recommended at Discharge Intermittent Supervision/Assistance  Patient can return home with the following  A little help with walking and/or transfers;A little help with bathing/dressing/bathroom;Assistance with cooking/housework;Assist for transportation    Equipment Recommendations  Rolling walker (2 wheels)  Recommendations for Other Services       Functional Status Assessment Patient has had a recent decline in their functional status and demonstrates the ability to make significant improvements in function in a reasonable and predictable amount of time.     Precautions / Restrictions Precautions Precautions: Fall Restrictions Weight Bearing Restrictions: No      Mobility  Bed Mobility               General bed mobility comments: Pt sitting up at EOB on arrival, appeared to have gotten some assist from nursing    Transfers Overall transfer level: Needs assistance Equipment used: Rolling walker (2 wheels)               General transfer comment: standard height bed (high bed at home), pt needed some extra time and cuing to insure he scooted to EOB and got LEs under him well - also needing cuing for hand placement and use.  OVerall    Ambulation/Gait Ambulation/Gait assistance: Supervision Gait Distance (Feet): 200 Feet Assistive device: Rolling walker (2 wheels), None         General Gait Details: Slow but relatively consistent gait.  Needed some cuing to increase step length and cadence, but able to maintain relatively well once he got started.  We did ambulate ~25 ft with no AD/no UEs, with slower and more guarded gait - though no LOBs.  Pt's vitals stable with the effort.  Stairs            Wheelchair Mobility    Modified Rankin (Stroke Patients Only)       Balance Overall balance assessment: Needs assistance  Sitting-balance support: Bilateral upper extremity supported Sitting balance-Leahy Scale: Fair Sitting balance - Comments: Pt was able to maintain static sitting EOB, leaning (nearly falling) backward until cued to scoot forward to actual EOB     Standing balance-Leahy Scale: Fair Standing balance comment: Pt not reliant on the walker to maintain balance, but clearly more stable with UE support.  No overt LOBs with  mild unsteadiness with increased challenge                             Pertinent Vitals/Pain Pain Assessment Pain Assessment: No/denies pain    Home Living Family/patient expects to be discharged to:: Private residence Living Arrangements: Spouse/significant other Available Help at Discharge: Family Type of Home: House Home Access: Stairs to enter Entrance Stairs-Rails: None Entrance Stairs-Number of Steps: small ledge   Home Layout: Multi-level;Able to live on main level with bedroom/bathroom Home Equipment: BSC/3in1;Cane - single point;Grab bars - tub/shower      Prior Function Prior Level of Function : Independent/Modified Independent             Mobility Comments: no AD at baseline, did slide off bed/fall in recent weeks - has not had other falls since R hip sx 7 months ago. ADLs Comments: wife assists with socks     Hand Dominance   Dominant Hand: Right    Extremity/Trunk Assessment   Upper Extremity Assessment Upper Extremity Assessment: Generalized weakness;Overall [facility] for tasks assessed    Lower Extremity Assessment Lower Extremity Assessment: Generalized weakness;Overall WFL for tasks assessed (R hip = to or stronger than L, grossly functional t/o LEs with mild general weakness)       Communication   Communication: HOH  Cognition Arousal/Alertness: Awake/alert Behavior During Therapy: Flat affect Overall Cognitive Status: History of cognitive impairments - at baseline                                 General Comments: wife and daughter endorse that he is close to back to his normal but still seemingly delayed or not quite himself        General Comments      Exercises     Assessment/Plan    PT Assessment Patient needs continued PT services  PT Problem List Decreased strength;Decreased activity tolerance;Decreased balance;Decreased mobility;Decreased knowledge of use of DME;Decreased safety awareness;Decreased  cognition       PT Treatment Interventions DME instruction;Gait training;Therapeutic activities;Functional mobility training;Therapeutic exercise;Balance training;Neuromuscular re-education;Patient/family education;Cognitive remediation    PT Goals (Current goals can be found in the Care Plan section)  Acute Rehab PT Goals Patient Stated Goal: Go home PT Goal Formulation: With patient Time For Goal Achievement: 11/12/22 Potential to Achieve Goals: Good    Frequency Min 2X/week     Co-evaluation               AM-PAC PT "6 Clicks" Mobility  Outcome Measure Help needed turning from your back to your side while in a flat bed without using bedrails?: A Little Help needed moving from lying on your back to sitting on the side of a flat bed without using bedrails?: A Little Help needed moving to and from a bed to a chair (including a wheelchair)?: A Little Help needed standing up from a chair using your arms (e.g., wheelchair or bedside chair)?: A Little Help needed to walk in hospital room?: A Little Help needed  climbing 3-5 steps with a railing? : A Little 6 Click Score: 18    End of Session Equipment Utilized During Treatment: Gait belt Activity Tolerance: Patient tolerated treatment well Patient left: with chair alarm set;with call bell/phone within reach;with family/visitor present;with nursing/sitter in room Nurse Communication: Mobility status PT Visit Diagnosis: Unsteadiness on feet (R26.81);Muscle weakness (generalized) (M62.81);Difficulty in walking, not elsewhere classified (R26.2)    Time: 2353-6144 PT Time Calculation (min) (ACUTE ONLY): 26 min   Charges:   PT Evaluation $PT Eval Low Complexity: 1 Low PT Treatments $Gait Training: 8-22 mins        Kreg Shropshire, DPT 10/30/2022, 9:54 AM

## 2022-10-30 NOTE — Procedures (Signed)
Routine EEG Report  Clayton Parker is a 81 y.o. male with a history of aphasia who is undergoing an EEG to evaluate for seizures.  Report: This EEG was acquired with electrodes placed according to the International 10-20 electrode system (including Fp1, Fp2, F3, F4, C3, C4, P3, P4, O1, O2, T3, T4, T5, T6, A1, A2, Fz, Cz, Pz). The following electrodes were missing or displaced: none.  The occipital dominant rhythm was 9 Hz. This activity is reactive to stimulation. Drowsiness was manifested by background fragmentation; deeper stages of sleep were identified by K complexes and sleep spindles. There was no focal slowing. There were no interictal epileptiform discharges. There were no electrographic seizures identified. Photic stimulation and hyperventilation were not performed.   Impression: This EEG was obtained while awake and asleep and is normal.    Clinical Correlation: Normal EEGs, however, do not rule out epilepsy.  Su Monks, MD [facility] [phone]  If 7pm- 7am, please page neurology on call as listed in [place].

## 2022-10-30 NOTE — Discharge Summary (Signed)
Physician Discharge Summary   Patient: Clayton Parker MRN: [id number] DOB: [birth year]  Admit date:     10/28/2022  Discharge date: 10/30/22  Discharge Physician: Arnetha Courser   PCP: Leim Fabry, MD   Recommendations at discharge:  Follow-up with primary care provider Follow-up with outpatient neurology  Discharge Diagnoses: Principal Problem:   Altered mental status Active Problems:   SIRS (systemic inflammatory response syndrome) (HCC)   Essential hypertension   Type 2 diabetes mellitus with chronic kidney disease, without long-term current use of insulin (HCC)   Fall at home, initial encounter   CAD (coronary artery disease)   GERD (gastroesophageal reflux disease)   AMS (altered mental status)   Hospital Course: Taken from H&P.  Clayton Parker is a 81 y.o. male with a known history of HTN, HLD, CAD s/p MI, diabetes, CKD presents to the emergency department for evaluation of altered mental status.  Patient was in a usual state of health until 7:30 PM on the day of arrival when his wife noticed that he was responding as usual, states that his speech was sluggish, he was slow to respond as if he were staring off into space.  She feels he is mostly back to baseline but still slightly slow to respond.     He occasionally feels unsteady on his feet and last week he fell once due to loss of balance.  He also reports that he has had a cold, nasal congestion and post-nasal drip for a month now.   He was diagnosed with a sinus infection and placed on amoxicillin last month.   ED course.  On arrival he was afebrile, mild tachycardia and tachypnea, labs pertinent for lactic acidosis at 3.8 which has been resolved now.  UA with few leukocytes. Troponin remain negative.  Cultures pending. Chest x-ray without any acute abnormality. CT head and MRI brain was negative for any acute abnormality or stroke. EKG. personally reviewed and only shows mild sinus tachycardia  11/5: Remained  afebrile, labs mostly unremarkable, lactic acidosis has been resolved. Initially admitted as sepsis and started on broad-spectrum antibiotics.  Blood cultures pending. Urine cultures were not done, UA with few leukocytes, added as add-on to complete the sepsis work-up.  Procalcitonin negative, de-escalating antibiotics to ceftriaxone.  Patient with gradually declining since April after his hip replacement surgery.  Had 3 hospitalizations since then for respiratory concern, in June he was found to have pneumonia and treated with antibiotics.  In July he was found to have large right pleural effusion, thoracentesis with transudative fluid, some concerns on flow cytometry so he was referred to oncology.  PET scan with some nonspecific abnormalities.  Per oncology note most likely parapneumonic effusion secondary to recent pneumonia they were recommending follow-up. Effusion seems to be resolved on current imaging.  PSA was also checked due to some concern of some activity on PET in his prostate and it was within normal limit. Patient continue to have progressive weakness with some history of unintentional weight loss. Had a recent fall.  Per wife she also noticed frequent staring spells, patient normally go back to his prior activity after that.  Wife does not sure about any persistent confusion after the spells.  Per wife his speech seems little more than normal. He was seen by telemetry neurology in ED with no specific recommendations. Might get benefit from getting an EEG-message sent to neurology.  11/7: Procalcitonin remain negative.  Repeat MRI with contrast was negative for any acute intracranial processes.  CTA  of the head and neck with with no acute intracranial process and no significant large vessel occlusion.  EEG normal.  Urine cultures negative. Echocardiogram with bubble study was without any significant abnormality.  Patient with slow declining and becoming slow, intermittent confusion  noted by wife. Patient need to have an evaluation done by neurology as an outpatient.  Patient will continue with his current medications and follow-up with his providers for further recommendations.    Assessment and Plan: * Altered mental status Patient with frequent transient staring spells and progressively worsening weakness.  CT head and MRI brain was negative for any acute stroke. Telemetry neuro was consulted initially in the ED-no specific recommendations. -I did get benefit from EEG -Recommend patient appears to be at baseline now.  SIRS (systemic inflammatory response syndrome) (HCC) Code sepsis was called initially, patient did not completely meet sepsis criteria so sepsis ruled out.  Blood cultures are pending.  UA with only few leukocytes but urine cultures were never sent and he was started on broad-spectrum antibiotics. Elevated lactic acid patient responded very well to fluid and normalized. Procalcitonin negative -Urine cultures as add-on to complete the work-up -De-escalate antibiotics to ceftriaxone for now as it is no other obvious infection, small concern of UTI.  Essential hypertension Blood pressure within goal. -Continue home metoprolol  Type 2 diabetes mellitus with chronic kidney disease, without long-term current use of insulin (HCC) Well-controlled with A1c of 6.5 -ssi  Fall at home, initial encounter Patient with worsening weakness and some weight loss which has been worked up by oncology and no specific reason found so far. -PT/OT evaluation  CAD (coronary artery disease) S/p PCI. No chest pain and troponin remain negative. -Continue home aspirin and statin -Continue beta-blocker  GERD (gastroesophageal reflux disease) -Continue home dose of PPI   Consultants: Neurology Procedures performed: EEG Disposition: Home Diet recommendation:  Discharge Diet Orders (From admission, onward)     Start     Ordered   10/30/22 0000  Diet - low sodium  heart healthy        10/30/22 1338           Cardiac and Carb modified diet DISCHARGE MEDICATION: Allergies as of 10/30/2022   No Known Allergies      Medication List     STOP taking these medications    lisinopril 10 MG tablet Commonly known as: ZESTRIL       TAKE these medications    aspirin EC 81 MG tablet Take 81 mg by mouth daily. Swallow whole.   atorvastatin 80 MG tablet Commonly known as: LIPITOR Take 80 mg by mouth daily.   cetirizine 10 MG tablet Commonly known as: ZYRTEC Take 10 mg by mouth daily.   CHEWABLE CALCIUM/D PO Take 2 tablets by mouth daily.   CoQ-10 100 MG Caps Take 100 mg by mouth daily.   cyanocobalamin 1000 MCG tablet Commonly known as: VITAMIN B12 Take 1,000 mcg by mouth 3 (three) times a week.   ferrous sulfate 325 (65 FE) MG EC tablet Take 325 mg by mouth daily with breakfast.   fluticasone 50 MCG/ACT nasal spray Commonly known as: FLONASE Place 2 sprays into both nostrils daily.   ipratropium 0.06 % nasal spray Commonly known as: ATROVENT Place 1 spray into the nose 2 (two) times daily.   metFORMIN 500 MG tablet Commonly known as: GLUCOPHAGE Take 500-1,000 mg by mouth 2 (two) times daily. 2 tabs qam  1 tab qpm   metoprolol succinate 25 MG  24 hr tablet Commonly known as: TOPROL-XL Take 1 tablet by mouth daily.   multivitamin tablet Take 2 tablets by mouth daily.   omeprazole 20 MG tablet Commonly known as: PRILOSEC OTC Take 20 mg by mouth 3 (three) times a week.   OSTEO BI-FLEX TRIPLE STRENGTH PO Take 2 tablets by mouth daily.   sildenafil 20 MG tablet Commonly known as: REVATIO Take 20 mg by mouth as needed.               Durable Medical Equipment  (From admission, onward)           Start     Ordered   10/30/22 1011  For home use only DME Walker rolling  Once       Question Answer Comment  Walker: With 5 Inch Wheels   Patient needs a walker to treat with the following condition Impaired  mobility      10/30/22 1010            Discharge Exam: There were no vitals filed for this visit. General.     In no acute distress. Pulmonary.  Lungs clear bilaterally, normal respiratory effort. CV.  Regular rate and rhythm, no JVD, rub or murmur. Abdomen.  Soft, nontender, nondistended, BS positive. CNS.  Alert and oriented .  No focal neurologic deficit. Extremities.  No edema, no cyanosis, pulses intact and symmetrical. Psychiatry.  Judgment and insight appears normal.   Condition at discharge: stable  The results of significant diagnostics from this hospitalization (including imaging, microbiology, ancillary and laboratory) are listed below for reference.   Imaging Studies: ECHOCARDIOGRAM COMPLETE BUBBLE STUDY  Result Date: 10/30/2022    ECHOCARDIOGRAM REPORT   Patient Name:   Clayton Parker Date of Exam: 10/30/2022 Medical Rec #:  [id number]          Height:       64.0 in Accession #:    [id number]         Weight:       144.0 lb Date of Birth:  1941/01/05          BSA:          1.701 m Patient Age:    81 years           BP:           157/74 mmHg Patient Gender: M                  HR:           80 bpm. Exam Location:  [facility] Procedure: 2D Echo, Color Doppler, Cardiac Doppler and Saline Contrast Bubble            Study Indications:     G45.9 TIA  History:         Patient has no prior history of Echocardiogram examinations.                  CAD, CKD; Risk Factors:Hypertension, Diabetes and Dyslipidemia.  Sonographer:     Humphrey Rolls Referring Phys:  [id number] Malachi Carl STACK Diagnosing Phys: Marcina Millard MD IMPRESSIONS  1. Left ventricular ejection fraction, by estimation, is 60 to 65%. The left ventricle has normal function. The left ventricle has no regional wall motion abnormalities. Left ventricular diastolic parameters were normal.  2. Right ventricular systolic function is normal. The right ventricular size is normal.  3. The mitral valve is normal in structure. Mild mitral  valve regurgitation. No evidence of mitral stenosis.  4. The aortic valve is normal in structure. Aortic valve regurgitation is not visualized. No aortic stenosis is present.  5. The inferior vena cava is normal in size with greater than 50% respiratory variability, suggesting right atrial pressure of 3 mmHg. FINDINGS  Left Ventricle: Left ventricular ejection fraction, by estimation, is 60 to 65%. The left ventricle has normal function. The left ventricle has no regional wall motion abnormalities. The left ventricular internal cavity size was normal in size. There is  no left ventricular hypertrophy. Left ventricular diastolic parameters were normal. Right Ventricle: The right ventricular size is normal. No increase in right ventricular wall thickness. Right ventricular systolic function is normal. Left Atrium: Left atrial size was normal in size. Right Atrium: Right atrial size was normal in size. Pericardium: There is no evidence of pericardial effusion. Mitral Valve: The mitral valve is normal in structure. Mild mitral valve regurgitation. No evidence of mitral valve stenosis. Tricuspid Valve: The tricuspid valve is normal in structure. Tricuspid valve regurgitation is mild . No evidence of tricuspid stenosis. Aortic Valve: The aortic valve is normal in structure. Aortic valve regurgitation is not visualized. No aortic stenosis is present. Aortic valve mean gradient measures 6.0 mmHg. Aortic valve peak gradient measures 9.7 mmHg. Aortic valve area, by VTI measures 2.66 cm. Pulmonic Valve: The pulmonic valve was normal in structure. Pulmonic valve regurgitation is not visualized. No evidence of pulmonic stenosis. Aorta: The aortic root is normal in size and structure. Venous: The inferior vena cava is normal in size with greater than 50% respiratory variability, suggesting right atrial pressure of 3 mmHg. IAS/Shunts: No atrial level shunt detected by color flow Doppler. Agitated saline contrast was given  intravenously to evaluate for intracardiac shunting.  LEFT VENTRICLE PLAX 2D LVIDd:         4.30 cm   Diastology LVIDs:         2.70 cm   LV e' medial:    8.27 cm/s LV PW:         1.00 cm   LV E/e' medial:  12.0 LV IVS:        0.90 cm   LV e' lateral:   15.80 cm/s LVOT diam:     2.10 cm   LV E/e' lateral: 6.3 LV SV:         86 LV SV Index:   50 LVOT Area:     3.46 cm  RIGHT VENTRICLE RV Basal diam:  3.50 cm RV S prime:     14.40 cm/s TAPSE (M-mode): 1.9 cm LEFT ATRIUM           Index        RIGHT ATRIUM           Index LA diam:      4.10 cm 2.41 cm/m   RA Area:     14.50 cm LA Vol (A2C): 22.6 ml 13.28 ml/m  RA Volume:   38.70 ml  22.75 ml/m LA Vol (A4C): 36.0 ml 21.16 ml/m  AORTIC VALVE                     PULMONIC VALVE AV Area (Vmax):    2.82 cm      PV Vmax:       0.77 m/s AV Area (Vmean):   2.81 cm      PV Vmean:      56.000 cm/s AV Area (VTI):     2.66 cm      PV VTI:  0.151 m AV Vmax:           156.00 cm/s   PV Peak grad:  2.4 mmHg AV Vmean:          114.000 cm/s  PV Mean grad:  1.0 mmHg AV VTI:            0.322 m AV Peak Grad:      9.7 mmHg AV Mean Grad:      6.0 mmHg LVOT Vmax:         127.00 cm/s LVOT Vmean:        92.400 cm/s LVOT VTI:          0.247 m LVOT/AV VTI ratio: 0.77  AORTA Ao Root diam: 3.00 cm MITRAL VALVE                TRICUSPID VALVE MV Area (PHT): 3.56 cm     TR Peak grad:   30.5 mmHg MV Decel Time: 213 msec     TR Vmax:        276.00 cm/s MV E velocity: 99.60 cm/s MV A velocity: 102.00 cm/s  SHUNTS MV E/A ratio:  0.98         Systemic VTI:  0.25 m                             Systemic Diam: 2.10 cm Marcina MillardAlexander Paraschos MD Electronically signed by Marcina MillardAlexander Paraschos MD Signature Date/Time: 10/30/2022/1:27:40 PM    Final    EEG adult  Result Date: 10/30/2022 Jefferson FuelStack, Colleen M, MD     10/30/2022  8:25 AM Routine EEG Report Clayton SheererFrederick J Parker is a 81 y.o. male with a history of aphasia who is undergoing an EEG to evaluate for seizures. Report: This EEG was acquired with  electrodes placed according to the International 10-20 electrode system (including Fp1, Fp2, F3, F4, C3, C4, P3, P4, O1, O2, T3, T4, T5, T6, A1, A2, Fz, Cz, Pz). The following electrodes were missing or displaced: none. The occipital dominant rhythm was 9 Hz. This activity is reactive to stimulation. Drowsiness was manifested by background fragmentation; deeper stages of sleep were identified by K complexes and sleep spindles. There was no focal slowing. There were no interictal epileptiform discharges. There were no electrographic seizures identified. Photic stimulation and hyperventilation were not performed. Impression: This EEG was obtained while awake and asleep and is normal.   Clinical Correlation: Normal EEGs, however, do not rule out epilepsy. Bing Neighborsolleen Stack, MD [facility] [phone] If 7pm- 7am, please page neurology on call as listed in AMION.   MR BRAIN W CONTRAST  Result Date: 10/30/2022 CLINICAL DATA:  Possible seizure EXAM: MRI HEAD WITH CONTRAST TECHNIQUE: Multiplanar, multiecho pulse sequences of the brain and surrounding structures were obtained with intravenous contrast. CONTRAST:  6mL GADAVIST GADOBUTROL 1 MMOL/ML IV SOLN COMPARISON:  MRI head without contrast 10/28/2022 FINDINGS: Brain: Abnormal parenchymal or meningeal enhancement. No evidence of acute hemorrhage, mass, mass effect, or midline shift. Vascular: Normal arterial and venous enhancement. Skull and upper cervical spine: No abnormal osseous enhancement. Sinuses/Orbits: Chronic right maxillary sinusitis. Status post bilateral lens replacements. Other: None. IMPRESSION: No acute intracranial process. No abnormal enhancement. Electronically Signed   By: Wiliam KeAlison  Vasan M.D.   On: 10/30/2022 03:02   CT ANGIO HEAD NECK W WO CM  Result Date: 10/30/2022 CLINICAL DATA:  Progressive weakness, gradual decline EXAM: CT ANGIOGRAPHY HEAD AND NECK TECHNIQUE: Multidetector CT imaging of the head and  neck was performed using the  standard protocol during bolus administration of intravenous contrast. Multiplanar CT image reconstructions and MIPs were obtained to evaluate the vascular anatomy. Carotid stenosis measurements (when applicable) are obtained utilizing NASCET criteria, using the distal internal carotid diameter as the denominator. RADIATION DOSE REDUCTION: This exam was performed according to the departmental dose-optimization program which includes automated exposure control, adjustment of the mA and/or kV according to patient size and/or use of iterative reconstruction technique. CONTRAST:  16mL OMNIPAQUE IOHEXOL 350 MG/ML SOLN COMPARISON:  10/28/2022 CT head FINDINGS: CT HEAD FINDINGS Brain: No evidence of acute infarct, hemorrhage, mass, mass effect, or midline shift. No hydrocephalus or extra-axial fluid collection. Periventricular white matter changes, likely the sequela of chronic small vessel ischemic disease. Cerebral atrophy is within normal limits for age. Vascular: No hyperdense vessel. Atherosclerotic calcifications in the intracranial carotid and vertebral arteries. Skull: Normal. Negative for fracture or focal lesion. Sinuses/Orbits: Chronic opacification of the right maxillary sinus. Status post bilateral lens replacements. Other: The mastoid air cells are well aerated. CTA NECK FINDINGS Aortic arch: Standard branching. Imaged portion shows no evidence of aneurysm or dissection. No significant stenosis of the major arch vessel origins. Right carotid system: No evidence of dissection, occlusion, or hemodynamically significant stenosis (greater than 50%). Atherosclerotic disease at the bifurcation and in the proximal ICA is not hemodynamically significant. Left carotid system: No evidence of dissection, occlusion, or hemodynamically significant stenosis (greater than 50%). Vertebral arteries: No evidence of dissection, occlusion, or hemodynamically significant stenosis (greater than 50%). Skeleton: No acute osseous  abnormality. Other neck: Asymmetric prominent right thyroid lobe, without focal concerning nodule. Upper chest: Small bilateral pleural effusions with associated atelectasis. Review of the MIP images confirms the above findings CTA HEAD FINDINGS Anterior circulation: Both internal carotid arteries are patent to the termini, without significant stenosis. A1 segments patent. Normal anterior communicating artery. Anterior cerebral arteries are patent to their distal aspects. No M1 stenosis or occlusion. MCA branches perfused and symmetric. Posterior circulation: Vertebral arteries patent to the vertebrobasilar junction without stenosis. Posterior inferior cerebellar arteries patent proximally. Basilar patent to its distal aspect. Superior cerebellar arteries patent proximally. Patent P1 segments. PCAs perfused to their distal aspects without stenosis. The right posterior communicating artery is patent. Possible diminutive left posterior communicating artery. Venous sinuses: As permitted by contrast timing, patent. Anatomic variants: None significant. Review of the MIP images confirms the above findings IMPRESSION: 1. No acute intracranial process. 2. No intracranial large vessel occlusion or significant stenosis. 3. No hemodynamically significant stenosis in the neck. 4. Small bilateral pleural effusions with associated atelectasis. Electronically Signed   By: Merilyn Baba M.D.   On: 10/30/2022 01:52   MR BRAIN WO CONTRAST  Result Date: 10/28/2022 CLINICAL DATA:  Initial evaluation for neuro deficit, stroke suspected. EXAM: MRI HEAD WITHOUT CONTRAST TECHNIQUE: Multiplanar, multiecho pulse sequences of the brain and surrounding structures were obtained without intravenous contrast. COMPARISON:  Prior head CT from earlier the same day. FINDINGS: Brain: Generalized age-related cerebral atrophy. Patchy and confluent T2/FLAIR hyperintensity involving the periventricular deep white matter both cerebral hemispheres as  well as the pons, most consistent with chronic small vessel ischemic disease, mild to moderate in nature. Few superimposed remote lacunar infarcts present about the hemispheric cerebral white matter and deep gray nuclei. No evidence for acute or subacute infarct. Gray-white matter differentiation maintained. No areas of chronic cortical infarction. No acute intracranial hemorrhage. Single punctate chronic microhemorrhage noted at the left periatrial white matter, likely small vessel related.  No mass lesion, midline shift or mass effect. No hydrocephalus or extra-axial fluid collection. Pituitary gland and suprasellar region within normal limits. Vascular: Major intracranial vascular flow voids are maintained. Skull and upper cervical spine: Craniocervical junction normal. Bone marrow signal intensity within normal limits. No scalp soft tissue abnormality. Sinuses/Orbits: Prior bilateral ocular lens replacement. Chronic right maxillary sinusitis noted. Mastoid air cells are clear. Other: None. IMPRESSION: 1. No acute intracranial abnormality. 2. Age-related cerebral atrophy with mild to moderate chronic microvascular ischemic disease. 3. Chronic right maxillary sinusitis. Electronically Signed   By: Rise Mu M.D.   On: 10/28/2022 22:55   DG Chest Port 1 View  Result Date: 10/28/2022 CLINICAL DATA:  Sepsis EXAM: PORTABLE CHEST 1 VIEW COMPARISON:  Radiographs 03/21/2018 FINDINGS: Mild cardiomegaly. Coronary stenting. Aortic atherosclerotic calcification. No focal consolidation, pleural effusion, or pneumothorax. Mild emphysema and bronchitic changes. No acute osseous abnormality. IMPRESSION: No focal pneumonia. Electronically Signed   By: Minerva Fester M.D.   On: 10/28/2022 22:47   CT HEAD CODE STROKE WO CONTRAST  Result Date: 10/28/2022 CLINICAL DATA:  Code stroke. Initial evaluation for neuro deficit, stroke. EXAM: CT HEAD WITHOUT CONTRAST TECHNIQUE: Contiguous axial images were obtained from  the base of the skull through the vertex without intravenous contrast. RADIATION DOSE REDUCTION: This exam was performed according to the departmental dose-optimization program which includes automated exposure control, adjustment of the mA and/or kV according to patient size and/or use of iterative reconstruction technique. COMPARISON:  Prior CT from 10/25/2016. FINDINGS: Brain: Atrophy with chronic small vessel ischemic disease. No acute intracranial hemorrhage. No acute large vessel territory infarct. No mass lesion, midline shift or mass effect. Ventricular prominence most likely due to global parenchymal volume loss. No hydrocephalus. No extra-axial fluid collection. Vascular: No abnormal hyperdense vessel. Scattered vascular calcifications noted within the carotid siphons. Skull: Scalp soft tissues and calvarium within normal limits. Sinuses/Orbits: Globes orbital soft tissues demonstrate no acute finding. Chronic right maxillary sinusitis noted. No mastoid effusion. Other: None. ASPECTS [facility] Stroke Program Early CT Score) - Ganglionic level infarction (caudate, lentiform nuclei, internal capsule, insula, M1-M3 cortex): 7 - Supraganglionic infarction (M4-M6 cortex): 3 Total score (0-10 with 10 being normal): 10 IMPRESSION: 1. No acute intracranial abnormality. 2. ASPECTS is 10. 3. Atrophy with moderately advanced chronic microvascular ischemic disease. 4. Chronic right maxillary sinusitis. Results were called by telephone at the time of interpretation on 10/28/2022 at 9:28 pm to provider MARK QUALE , who verbally acknowledged these results. Electronically Signed   By: Rise Mu M.D.   On: 10/28/2022 21:31    Microbiology: Results for orders placed or performed during the hospital encounter of 10/28/22  Urine Culture     Status: None   Collection Time: 10/28/22  9:17 PM   Specimen: Urine, Clean Catch  Result Value Ref Range Status   Specimen Description   Final    URINE, CLEAN  CATCH Performed at [facility], [address] [place], [place] [address]    Special Requests   Final    NONE Performed at [facility], [address] [place], [place] [address]    Culture   Final    NO GROWTH Performed at [facility], [address], [place], [place] [address]    Report Status 10/30/2022 FINAL  Final  Culture, blood (routine x 2)     Status: None (Preliminary result)   Collection Time: 10/28/22 11:12 PM   Specimen: BLOOD  Result Value Ref Range Status  Specimen Description BLOOD BLOOD LEFT ARM  Final   Special Requests   Final    BOTTLES DRAWN AEROBIC AND ANAEROBIC Blood Culture adequate volume   Culture   Final    NO GROWTH 2 DAYS Performed at [facility] [facility], [address] [place], [place] [address]    Report Status PENDING  Incomplete  Culture, blood (routine x 2)     Status: None (Preliminary result)   Collection Time: 10/28/22 11:13 PM   Specimen: BLOOD  Result Value Ref Range Status   Specimen Description BLOOD BLOOD RIGHT ARM  Final   Special Requests   Final    BOTTLES DRAWN AEROBIC AND ANAEROBIC Blood Culture adequate volume   Culture   Final    NO GROWTH 2 DAYS Performed at [facility], [address] [place], [place] [address]    Report Status PENDING  Incomplete    Labs: CBC: Recent Labs  Lab 10/28/22 2107 10/29/22 0355  WBC 10.1 10.0  NEUTROABS 6.3  --   HGB 12.5* 11.0*  HCT 38.9* 33.6*  MCV 90.5 90.6  PLT 262 214   Basic Metabolic Panel: Recent Labs  Lab 10/28/22 2107 10/29/22 0355  NA 142 140  K 4.3 4.3  CL 108 109  CO2 24 26  GLUCOSE 112* 109*  BUN 33* 28*  CREATININE 1.03 0.90  CALCIUM 9.4 9.1   Liver Function Tests: Recent Labs  Lab 10/28/22 2107 10/29/22 0355  AST 34 28  ALT 27 26  ALKPHOS 87 80  BILITOT 0.8 0.9  PROT 7.0 5.8*  ALBUMIN 3.9 3.2*   CBG: Recent Labs  Lab 10/29/22 2147 10/30/22 0118 10/30/22 0447 10/30/22 0732 10/30/22 1227  GLUCAP  124* 116* 99 93 132*    Discharge time spent: greater than 30 minutes.  This record has been created using Conservation officer, historic buildings. Errors have been sought and corrected,but may not always be located. Such creation errors do not reflect on the standard of care.   Signed: Arnetha Courser, MD [facility] 10/30/2022

## 2022-10-30 NOTE — Plan of Care (Signed)
  Problem: Fluid Volume: Goal: Hemodynamic stability will improve Outcome: Progressing   Problem: Clinical Measurements: Goal: Diagnostic test results will improve Outcome: Progressing   Problem: Respiratory: Goal: Ability to maintain adequate ventilation will improve Outcome: Progressing   Problem: Clinical Measurements: Goal: Will remain free from infection Outcome: Progressing   Problem: Clinical Measurements: Goal: Ability to maintain clinical measurements within normal limits will improve Outcome: Progressing   Problem: Activity: Goal: Risk for activity intolerance will decrease Outcome: Progressing   Problem: Nutrition: Goal: Adequate nutrition will be maintained Outcome: Progressing   Problem: Coping: Goal: Level of anxiety will decrease Outcome: Progressing   Problem: Elimination: Goal: Will not experience complications related to urinary retention Outcome: Progressing   Problem: Pain Managment: Goal: General experience of comfort will improve Outcome: Progressing   Problem: Safety: Goal: Ability to remain free from injury will improve Outcome: Progressing   Problem: Skin Integrity: Goal: Risk for impaired skin integrity will decrease Outcome: Progressing

## 2022-10-30 NOTE — Progress Notes (Signed)
*  PRELIMINARY RESULTS* Echocardiogram 2D Echocardiogram has been performed.  Clayton Parker 10/30/2022, 11:47 AM

## 2022-10-30 NOTE — Evaluation (Signed)
Occupational Therapy Evaluation Patient Details Name: Clayton Parker MRN: [id number] DOB: Jul 22, 1941 Today's Date: 10/30/2022   History of Present Illness 81 y.o. male with a known history of HTN, HLD, CAD s/p MI, diabetes, CKD presented to the emergency department for evaluation of altered mental status.  Wife noticed that he was not responding as usual, states that his speech was sluggish, he was slow to respond as if he were staring off into space.  She feels he is mostly back to baseline but still slightly slow to respond.  Pt has apparently had PNA/congestion for >1 month.  S/P R total hip replacement ~7 months ago.   Clinical Impression   Patient presenting with decreased independence in self-care, functional mobility, safety, balance, strength, and endurance. Patient reports her lives at home with his wife who is able to provide 24/7 assistance. Patient reports he has a walk-in shower, SPC, RW, and comfort height toilet. At baseline patient reports he does not use any AD to ambulate, and requires some assistance for LB dressing (socks). Patient currently functioning at supervision for transfer sit<>stand using RW. Patient was able to ambulate ~30 ft in room using RW with close supervision, requiring frequent VC for proper RW use and hand placement. Mildly unsteady, but no LOB observed.  Patient with delayed responses throughout session with increased time able to follow some commands. Patient educated on using call bell to go to the bathroom since the purewick was removed prior to session. Patient left in recliner with call bell in reach, chair alarm set, family in room, and all needs met. Patient will benefit from acute OT to increase overall independence in the areas of ADLs, functional mobility,  in order to safely discharge home.       Recommendations for follow up therapy are one component of a multi-disciplinary discharge planning process, led by the attending physician.  Recommendations  may be updated based on patient status, additional functional criteria and insurance authorization.   Follow Up Recommendations  No OT follow up    Assistance Recommended at Discharge Frequent or constant Supervision/Assistance  Patient can return home with the following A little help with walking and/or transfers;A little help with bathing/dressing/bathroom;Assist for transportation;Help with stairs or ramp for entrance    Functional Status Assessment  Patient has had a recent decline in their functional status and demonstrates the ability to make significant improvements in function in a reasonable and predictable amount of time.  Equipment Recommendations  None recommended by OT       Precautions / Restrictions Precautions Precautions: Fall Restrictions Weight Bearing Restrictions: No      Mobility Bed Mobility               General bed mobility comments: PAtient in recliner upon arrival.    Transfers Overall transfer level: Needs assistance Equipment used: Rolling walker (2 wheels) Transfers: Sit to/from Stand Sit to Stand: Supervision           General transfer comment: Frequent VC for hand placement and use of RW throughout session.      Balance Overall balance assessment: Needs assistance Sitting-balance support: Feet supported, No upper extremity supported Sitting balance-Leahy Scale: Fair       Standing balance-Leahy Scale: Fair Standing balance comment: Pt not reliant on the walker to maintain balance, but clearly more stable with UE support.  No overt LOBs with mild unsteadiness with increased challenge  ADL either performed or assessed with clinical judgement   ADL Overall ADL's : Needs assistance/impaired     Grooming: Oral care;Wash/dry face;Supervision/safety;Standing                     Toilet Transfer Details (indicate cue type and reason): patient stood to urinate; min A for  safety. Toileting- Clothing Manipulation and Hygiene: Supervision/safety       Functional mobility during ADLs: Cueing for safety;Cueing for sequencing;Minimal assistance        Pertinent Vitals/Pain Pain Assessment Pain Assessment: No/denies pain     Hand Dominance Right   Extremity/Trunk Assessment Upper Extremity Assessment Upper Extremity Assessment: Generalized weakness   Lower Extremity Assessment Lower Extremity Assessment: Generalized weakness       Communication Communication Communication: HOH   Cognition Arousal/Alertness: Awake/alert Behavior During Therapy: WFL for tasks assessed/performed Overall Cognitive Status: History of cognitive impairments - at baseline Area of Impairment: Following commands, Safety/judgement, Awareness                       Following Commands: Follows one step commands inconsistently Safety/Judgement: Decreased awareness of safety Awareness: Intellectual   General Comments: wife and daughter endorse that he is close to back to his normal but still seemingly delayed or not quite himself. frequent VC required during session.                Home Living Family/patient expects to be discharged to:: Private residence Living Arrangements: Spouse/significant other Available Help at Discharge: Family;Available 24 hours/day Type of Home: House Home Access: Stairs to enter [employer] of Steps: small ledge Entrance Stairs-Rails: None Home Layout: Multi-level;Able to live on main level with bedroom/bathroom     Bathroom Shower/Tub: Occupational psychologist: Standard     Home Equipment: BSC/3in1;Cane - single point;Grab bars - tub/shower      Lives With: Spouse    Prior Functioning/Environment Prior Level of Function : Independent/Modified Independent             Mobility Comments: no AD at baseline, did slide off bed/fall in recent weeks - has not had other falls since R hip sx 7 months  ago. ADLs Comments: wife assists with socks        OT Problem List: Decreased strength;Decreased safety awareness;Impaired balance (sitting and/or standing);Decreased activity tolerance;Decreased knowledge of use of DME or AE      OT Treatment/Interventions: Self-care/ADL training;Therapeutic activities;Therapeutic exercise;Patient/family education;DME and/or AE instruction    OT Goals(Current goals can be found in the care plan section) Acute Rehab OT Goals Patient Stated Goal: to return home OT Goal Formulation: With patient/family Time For Goal Achievement: 11/13/22 Potential to Achieve Goals: Good ADL Goals Pt Will Perform Lower Body Bathing: with supervision Pt Will Perform Lower Body Dressing: with supervision Pt Will Transfer to Toilet: with supervision Pt Will Perform Toileting - Clothing Manipulation and hygiene: with supervision  OT Frequency: Min 2X/week       AM-PAC OT "6 Clicks" Daily Activity     Outcome Measure Help from another person eating meals?: None Help from another person taking care of personal grooming?: A Little Help from another person toileting, which includes using toliet, bedpan, or urinal?: A Little Help from another person bathing (including washing, rinsing, drying)?: A Little Help from another person to put on and taking off regular upper body clothing?: None Help from another person to put on and taking off regular lower body clothing?: A  Lot 6 Click Score: 19   End of Session Equipment Utilized During Treatment: Rolling walker (2 wheels);Gait belt Nurse Communication: Mobility status  Activity Tolerance: Patient tolerated treatment well Patient left: in chair;with call bell/phone within reach;with chair alarm set;with family/visitor present  OT Visit Diagnosis: Unsteadiness on feet (R26.81);Muscle weakness (generalized) (M62.81)                Time: 8457-3344 OT Time Calculation (min): 32 min Charges:       Tomasa Blase,  OTS 10/30/2022, 11:01 AM

## 2022-10-30 NOTE — TOC Progression Note (Signed)
Transition of Care [facility]) - Progression Note    Patient Details  Name: Clayton Parker MRN: [id number] Date of Birth: [birth year]  Transition of Care [facility]) CM/SW [place], RN Phone Number: 10/30/2022, 10:12 AM  Clinical Narrative:    Adapt to deliver a rolling walker to the bedside, he will go to outpatient pT at a facility of his choice        Expected Discharge Plan and Services                                                 Social Determinants of Health (SDOH) Interventions    Readmission Risk Interventions     No data to display

## 2022-10-30 NOTE — Progress Notes (Signed)
S: No further events since admission  MRI brain NAICP - personal review  O:  Vitals:   10/30/22 0020 10/30/22 0718  BP: (!) 148/73 (!) 157/74  Pulse: 72 74  Resp: 20 16  Temp: 98.6 F (37 C) 97.9 F (36.6 C)  SpO2: 97% 98%    Physical Exam Gen: A&Ox4, NAD HEENT: Atraumatic, normocephalic; oropharynx clear, tongue without atrophy or fasciculations. Resp: CTAB, normal work of breathing CV: RRR, extremities appear well-perfused. Abd: soft/NT/ND Extrem: Nml bulk; no cyanosis, clubbing, or edema.  Neuro: *MS: A&O x4. Follows multi-step commands.  *Speech: no dysarthria or aphasia, able to name and repeat. *CN:    I: Deferred   II,III: PERRLA, VFF by confrontation, optic discs not visualized 2/2 pupillary constriction   III,IV,VI: EOMI w/o nystagmus, no ptosis   V: Sensation intact from V1 to V3 to LT   VII: Eyelid closure was full.  Smile symmetric.   VIII: Hearing intact to voice   IX,X: Voice normal, palate elevates symmetrically    XI: SCM/trap 5/5 bilat   XII: Tongue protrudes midline, no atrophy or fasciculations  *Motor:   Normal bulk.  No tremor, rigidity or bradykinesia. No pronator drift.   Strength: Dlt Bic Tri WE WrF FgS Gr HF KnF KnE PlF DoF    Left 5 5 5 5 5 5 5 5 5 5 5 5     Right 5 5 5 5 5 5 5 5 5 5 5 5    *Sensory: Intact to light touch, pinprick, temperature vibration throughout. Symmetric. Propioception intact bilat.  No double-simultaneous extinction.  *Coordination:  Finger-to-nose, heel-to-shin, rapid alternating motions were intact. *Reflexes:  2+ and symmetric throughout without clonus; toes down-going bilat *Gait: deferred  NIHSS = 0  A/P: 81 yo man admitted after staring spell with speaking jibberish. MRI brain neg for acute infarct, performed without contrast. His presenting event is c/f TIA or less likely seizure with impaired awareness (no prior hx of seizure). His wife describes multiple incidents recently where he appears to be staring off but  when she speaks to him he "come out of it." These are favored to be 2/2 his ongoing hearing loss and not seizures since he comes out of them when spoken to. Recommend completion of TIA vs seizure workup with MRI brain additional sequences with contrast, CTA H&N, EEG, and TTE.  - MRI brain w contrast - CTA H&N - EEG - TTE - Continue ASA 81mg  daily  Will f/u results tmrw.  Su Monks, MD [facility] [phone]  If 7pm- 7am, please page neurology on call as listed in [place].

## 2022-11-02 LAB — CULTURE, BLOOD (ROUTINE X 2)
Culture: NO GROWTH
Culture: NO GROWTH
Special Requests: ADEQUATE
Special Requests: ADEQUATE

## 2023-07-21 IMAGING — DX DG HIP (WITH OR WITHOUT PELVIS) 1V PORT*R*
2 series · 2 of 2 positions shown · non-contrast
Comparison: None.

CLINICAL DATA: Postoperative

EXAM:
DG HIP (WITH OR WITHOUT PELVIS) 1V PORT RIGHT

[pelvis ap]
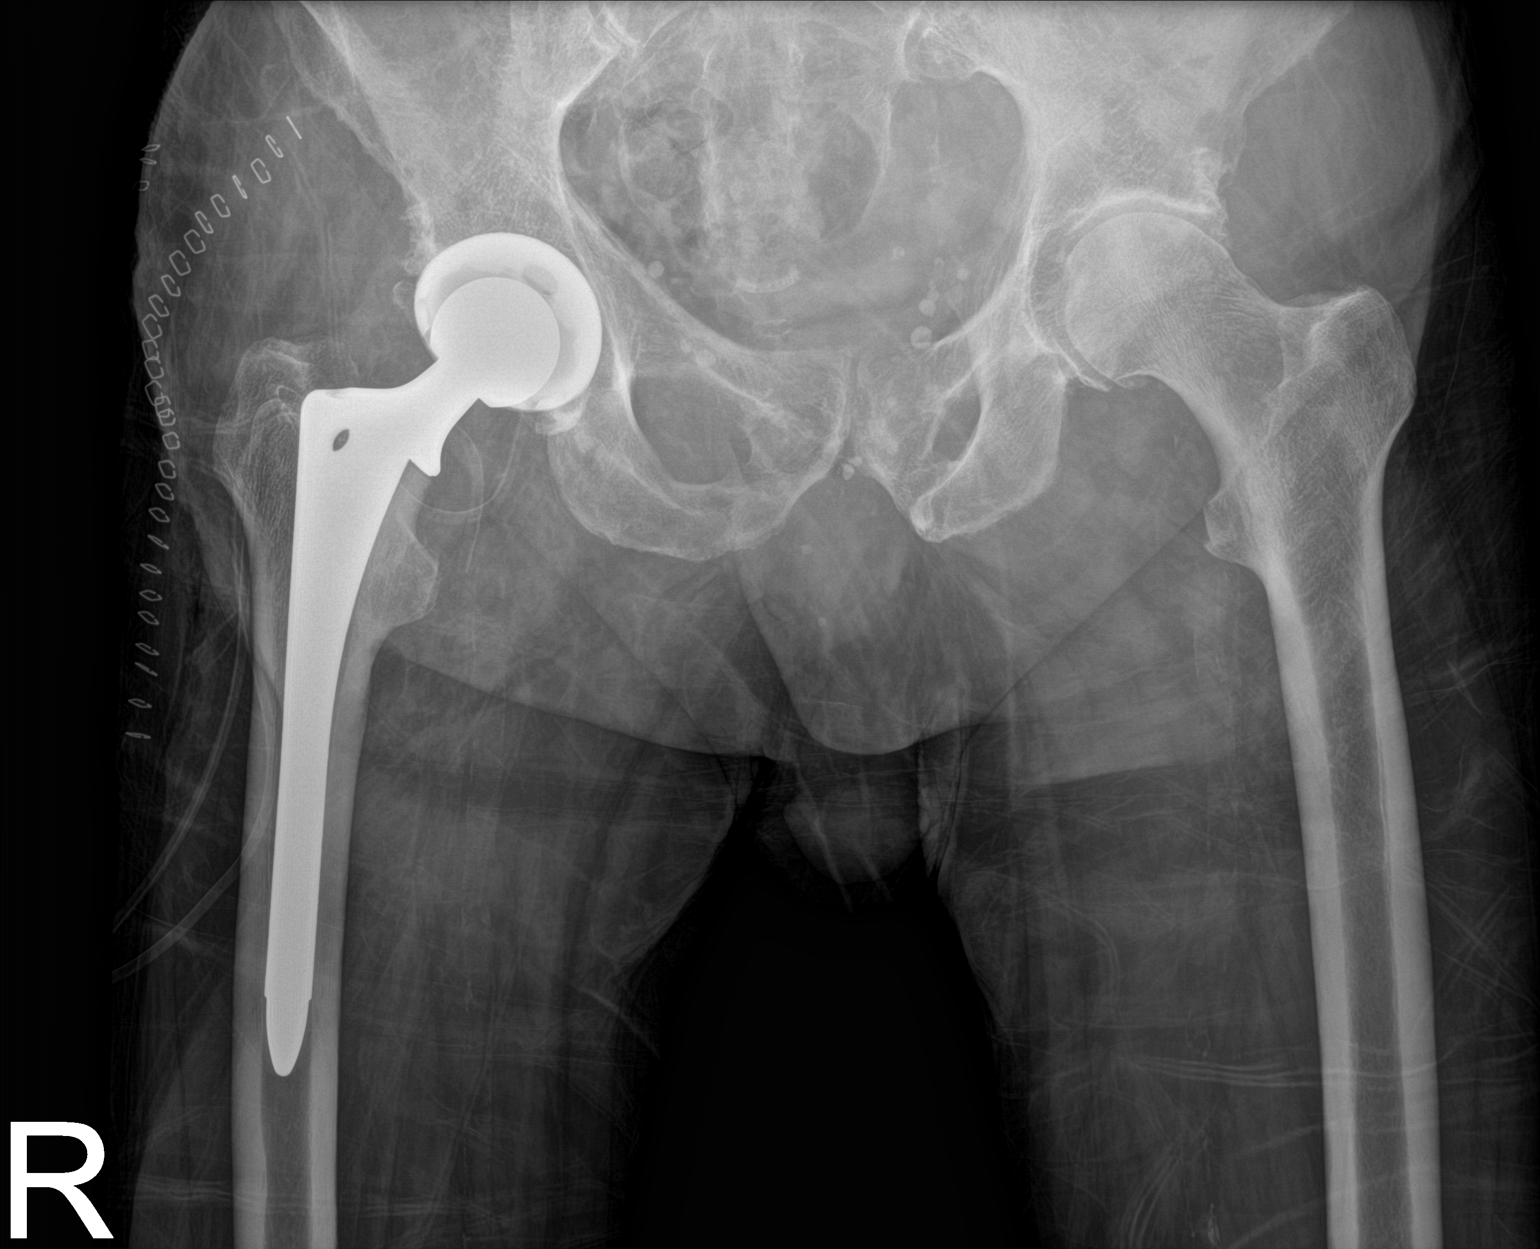

[hip lat]
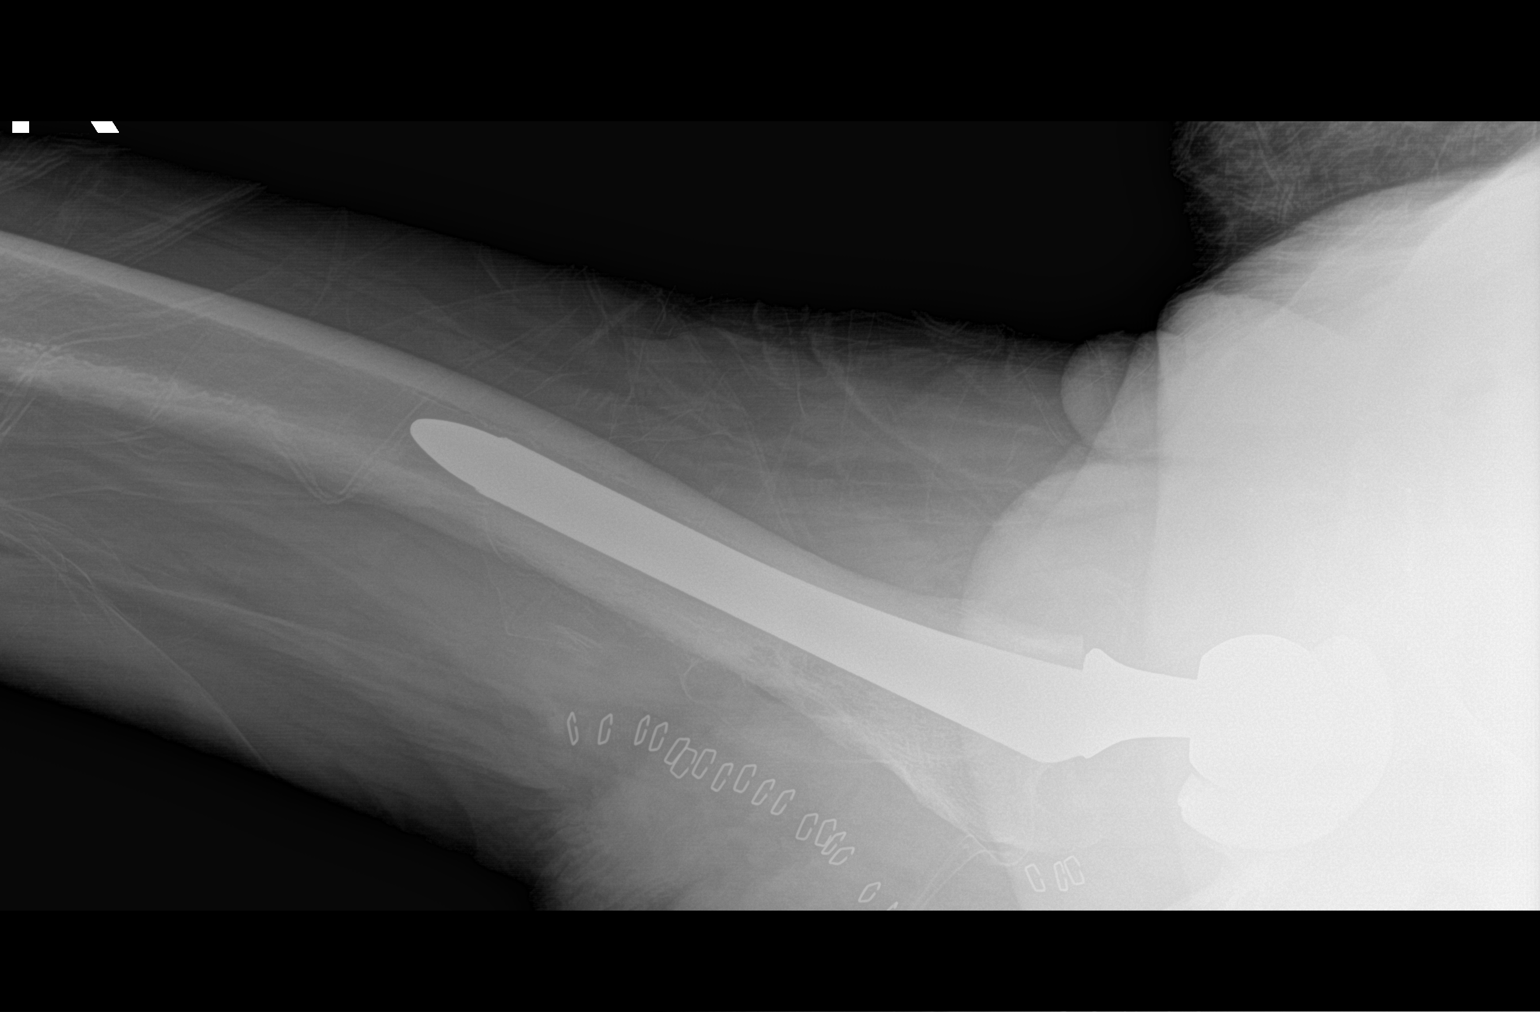

[2 of 2 positions shown; findings below may reference images not displayed]

FINDINGS: Status post recent total right hip arthroplasty. No perihardware
lucency is seen to indicate evidence of hardware loosening. Expected
postoperative changes including right hip soft tissue swelling
posterolateral surgical skin staples, and a right hip drain.
Moderate left femoroacetabular joint space narrowing and peripheral
osteophytosis. Numerous vascular phleboliths overlie the pelvis. No
acute fracture or dislocation.
IMPRESSION: Status post recent total right hip arthroplasty without evidence of
hardware failure.

## 2024-10-27 ENCOUNTER — Emergency Department: Admission: EM | Admit: 2024-10-27 | Discharge: 2024-10-27 | Disposition: A | Discharge status: Home / Self Care

## 2024-10-27 ENCOUNTER — Emergency Department

## 2024-10-27 ENCOUNTER — Other Ambulatory Visit: Payer: Self-pay

## 2024-10-27 DIAGNOSIS — I2510 Atherosclerotic heart disease of native coronary artery without angina pectoris: Secondary | ICD-10-CM | POA: Diagnosis not present

## 2024-10-27 DIAGNOSIS — W19XXXA Unspecified fall, initial encounter: Secondary | ICD-10-CM

## 2024-10-27 DIAGNOSIS — Z96641 Presence of right artificial hip joint: Secondary | ICD-10-CM | POA: Diagnosis not present

## 2024-10-27 DIAGNOSIS — E1122 Type 2 diabetes mellitus with diabetic chronic kidney disease: Secondary | ICD-10-CM | POA: Insufficient documentation

## 2024-10-27 DIAGNOSIS — W01198A Fall on same level from slipping, tripping and stumbling with subsequent striking against other object, initial encounter: Secondary | ICD-10-CM | POA: Insufficient documentation

## 2024-10-27 DIAGNOSIS — S0990XA Unspecified injury of head, initial encounter: Secondary | ICD-10-CM | POA: Diagnosis present

## 2024-10-27 DIAGNOSIS — M25521 Pain in right elbow: Secondary | ICD-10-CM | POA: Diagnosis not present

## 2024-10-27 DIAGNOSIS — S0001XA Abrasion of scalp, initial encounter: Secondary | ICD-10-CM | POA: Insufficient documentation

## 2024-10-27 DIAGNOSIS — N189 Chronic kidney disease, unspecified: Secondary | ICD-10-CM | POA: Diagnosis not present

## 2024-10-27 DIAGNOSIS — M25551 Pain in right hip: Secondary | ICD-10-CM | POA: Diagnosis not present

## 2024-10-27 LAB — COMPREHENSIVE METABOLIC PANEL WITH GFR
ALT: 18 U/L (ref 0–44)
AST: 28 U/L (ref 15–41)
Albumin: 4 g/dL (ref 3.5–5.0)
Alkaline Phosphatase: 90 U/L (ref 38–126)
Anion gap: 11 (ref 5–15)
BUN: 26 mg/dL — ABNORMAL HIGH (ref 8–23)
CO2: 27 mmol/L (ref 22–32)
Calcium: 8.9 mg/dL (ref 8.9–10.3)
Chloride: 100 mmol/L (ref 98–111)
Creatinine, Ser: 1.09 mg/dL (ref 0.61–1.24)
GFR, Estimated: >60 mL/min (ref >60)
Glucose, Bld: 196 mg/dL — ABNORMAL HIGH (ref 70–99)
Potassium: 4.2 mmol/L (ref 3.5–5.1)
Sodium: 138 mmol/L (ref 135–145)
Total Bilirubin: 0.8 mg/dL (ref 0.0–1.2)
Total Protein: 7.3 g/dL (ref 6.5–8.1)

## 2024-10-27 LAB — CBC WITH DIFFERENTIAL/PLATELET
Abs Immature Granulocytes: 0.06 K/uL (ref 0.00–0.07)
Basophils Absolute: 0 K/uL (ref 0.0–0.1)
Basophils Relative: 0 %
Eosinophils Absolute: 0.2 K/uL (ref 0.0–0.5)
Eosinophils Relative: 2 %
HCT: 38.3 % — ABNORMAL LOW (ref 39.0–52.0)
Hemoglobin: 12.3 g/dL — ABNORMAL LOW (ref 13.0–17.0)
Immature Granulocytes: 1 %
Lymphocytes Relative: 21 %
Lymphs Abs: 1.8 K/uL (ref 0.7–4.0)
MCH: 30.4 pg (ref 26.0–34.0)
MCHC: 32.1 g/dL (ref 30.0–36.0)
MCV: 94.8 fL (ref 80.0–100.0)
Monocytes Absolute: 0.7 K/uL (ref 0.1–1.0)
Monocytes Relative: 9 %
Neutro Abs: 5.4 K/uL (ref 1.7–7.7)
Neutrophils Relative %: 67 %
Platelets: 328 K/uL (ref 150–400)
RBC: 4.04 MIL/uL — ABNORMAL LOW (ref 4.22–5.81)
RDW: 13 % (ref 11.5–15.5)
WBC: 8.2 K/uL (ref 4.0–10.5)
nRBC: 0 % (ref 0.0–0.2)

## 2024-10-27 LAB — PROTIME-INR
INR: 0.9 (ref 0.8–1.2)
Prothrombin Time: 13 s (ref 11.4–15.2)

## 2024-10-27 NOTE — ED Triage Notes (Signed)
 Pt came in via [facility] due to a fall at [employer] while trying to reach for bread in the top shelf which had wheels. Pt felt straight back. No lost of consciousness, pt on Plavix. Laceration to back of the head, bleeding is controlled at this time. Pain of 2 out of 10 at this time. Alert and oriented x4

## 2024-10-27 NOTE — Discharge Instructions (Signed)
 Your evaluation in the emergency department is reassuring, and we saw no traumatic injuries from your fall.  Your tetanus shot was up-to-date so you did not require a new one.  You can use Tylenol  for any ongoing discomfort.  Please follow-up with your primary care doctor for reevaluation, and return to the emergency department with any new or worsening symptoms.

## 2024-10-27 NOTE — ED Provider Notes (Signed)
 [facility] Provider Note    None    (approximate)   History   Fall  Pt came in via [facility] due to a fall at [employer] while trying to reach for bread in the top shelf which had wheels. Pt felt straight back. No lost of consciousness, pt on Plavix. Laceration to back of the head, bleeding is controlled at this time. Pain of 2 out of 10 at this time. Alert and oriented x4   HPI Clayton Parker is a 83 y.o. male PMH CKD, CAD, diabetes, pretension, hyperlipidemia, prior right hip replacement presents for evaluation after a fall - Patient was at [facility] around 930 this morning and is reaching up on a shelf, lost his balance and a shelf fell on him.  He fell back hitting his head.  No loss of consciousness, no subsequent vomiting.  Does have mild posterior headache.  He is on Plavix.  No other blood thinners. -Has not been ambulatory since then.  Does note some mild right elbow pain and right hip pain. -Otherwise been in his usual state of health.  No recent dizziness, infectious symptoms, palpitations. -Accompanied by family who provides collateral -Unsure date of last Tdap     Physical Exam   Triage Vital Signs: ED Triage Vitals  Encounter Vitals Group     BP --      Girls Systolic BP Percentile --      Girls Diastolic BP Percentile --      Boys Systolic BP Percentile --      Boys Diastolic BP Percentile --      Pulse --      Resp --      Temp --      Temp src --      SpO2 --      Weight 10/27/24 1050 143 lb (64.9 kg)     Height 10/27/24 1050 5' 4 (1.626 m)     Head Circumference --      Peak Flow --      Pain Score 10/27/24 1047 0     Pain Loc --      Pain Education --      Exclude from Growth Chart --     Most recent vital signs: Vitals:   10/27/24 1053 10/27/24 1535  BP: (!) 147/68 137/70  Pulse: 78 80  Resp: (!) 22 12  Temp: 97.9 F (36.6 C) 97.9 F (36.6 C)  SpO2: 100% 100%     General: Awake, no distress.  HEENT: Small scalp  abrasions to occiput, not contaminated, no underlying lacerations.  No midline neck pain. CV:  Good peripheral perfusion. RRR, RP 2+ Resp:  Normal effort. CTAB Abd:  No distention. Nontender to deep palpation throughout Other:  Mild hematoma over right elbow with mild tenderness palpation.  Full range of motion.  Right hip with very mild tenderness to palpation.  No limb length discrepancy nor abnormal rotation.   ED Results / Procedures / Treatments   Labs (all labs ordered are listed, but only abnormal results are displayed) Labs Reviewed  CBC WITH DIFFERENTIAL/PLATELET - Abnormal; Notable for the following components:      Result Value   RBC 4.04 (*)    Hemoglobin 12.3 (*)    HCT 38.3 (*)    All other components within normal limits  COMPREHENSIVE METABOLIC PANEL WITH GFR - Abnormal; Notable for the following components:   Glucose, Bld 196 (*)    BUN 26 (*)  All other components within normal limits  PROTIME-INR     EKG  Ecg = sinus rhythm, rate 83, no gross ST elevation or depression, no significant repolarization normality, normal axis, normal intervals.  No clear evidence of ischemia no arrhythmia my interpretation.   RADIOLOGY Radiology interpreted by myself and radiology reports reviewed.  No acute pathology identified.    PROCEDURES:  Critical Care performed: No  Procedures   MEDICATIONS ORDERED IN ED: Medications - No data to display   IMPRESSION / MDM / ASSESSMENT AND PLAN / ED COURSE  I reviewed the triage vital signs and the nursing notes.                              DDX/MDM/AP: Differential diagnosis includes, but is not limited to, apparent mechanical fall, consider possibility of intracranial hemorrhage, skull fracture, C-spine injury.  Considered but doubt elbow fracture or right hip injury.  No history to suggest underlying infection or other pathology contributing to fall today.  Plan: - Basic labs  -CT head, CT C-spine - X-ray of right  elbow, pelvis - Patient denies need for pain control - N.p.o. - Last Tdap 2017, no indication for repeat at this time, wound not grossly contaminated  Patient's presentation is most consistent with acute presentation with potential threat to life or bodily function.  The patient is on the cardiac monitor to evaluate for evidence of arrhythmia and/or significant heart rate changes.  ED course below.  No prior traumatic injuries.  Feeling well amenable to discharge home.  Laboratory workup stable.  No evidence of acute pathology at this time.  Stable for discharge home.  Recommend Tylenol  as needed for any ongoing discomfort.  Clinical Course as of 10/27/24 1622  Tue Oct 27, 2024  1132 CBC and CMP reviewed, unremarkable [MM]  1309 XR pelvis: IMPRESSION: No acute fracture, pelvic bone diastasis, or dislocation.   [MM]  1310 XR R elbow: IMPRESSION: Moderate soft tissue swelling about the olecranon posteriorly, possibly representing a soft tissue contusion. Otherwise, no acute fracture or dislocation.   [MM]  1323 CTH / CTCspine: IMPRESSION: 1. No acute intracranial abnormality. 2. No acute fracture or traumatic malalignment of the cervical spine. 3. High right posterior scalp contusion.   [MM]    Clinical Course User Index [MM] Clarine Ozell LABOR, MD     FINAL CLINICAL IMPRESSION(S) / ED DIAGNOSES   Final diagnoses:  Fall, initial encounter  Abrasion of scalp, initial encounter     Rx / DC Orders   ED Discharge Orders     None        Note:  This document was prepared using Dragon voice recognition software and may include unintentional dictation errors.   Clarine Ozell LABOR, MD 10/27/24 1622
